# Patient Record
Sex: Male | Born: 1975 | Race: Black or African American | Hispanic: No | Marital: Single | Smoking: Current some day smoker
Health system: Southern US, Community
[De-identification: ages and names within clinical notes are randomized; demographics above are authoritative.]

## PROBLEM LIST (undated history)

## (undated) DIAGNOSIS — S39012A Strain of muscle, fascia and tendon of lower back, initial encounter: Secondary | ICD-10-CM

## (undated) DIAGNOSIS — L509 Urticaria, unspecified: Secondary | ICD-10-CM

## (undated) DIAGNOSIS — L709 Acne, unspecified: Secondary | ICD-10-CM

## (undated) DIAGNOSIS — L0291 Cutaneous abscess, unspecified: Secondary | ICD-10-CM

## (undated) HISTORY — DX: Strain of muscle, fascia and tendon of lower back, initial encounter: S39.012A

## (undated) HISTORY — DX: Urticaria, unspecified: L50.9

## (undated) HISTORY — PX: ABSCESS DRAINAGE: SHX1119

---

## 2001-08-19 ENCOUNTER — Emergency Department (HOSPITAL_COMMUNITY): Admission: EM | Admit: 2001-08-19 | Discharge: 2001-08-19 | Payer: Self-pay | Admitting: *Deleted

## 2001-08-21 ENCOUNTER — Observation Stay (HOSPITAL_COMMUNITY): Admission: EM | Admit: 2001-08-21 | Discharge: 2001-08-22 | Payer: Self-pay | Admitting: Emergency Medicine

## 2001-08-23 ENCOUNTER — Encounter (HOSPITAL_COMMUNITY): Admission: RE | Admit: 2001-08-23 | Discharge: 2001-09-22 | Payer: Self-pay | Admitting: General Surgery

## 2003-05-22 ENCOUNTER — Emergency Department (HOSPITAL_COMMUNITY): Admission: EM | Admit: 2003-05-22 | Discharge: 2003-05-23 | Payer: Self-pay | Admitting: Emergency Medicine

## 2003-09-28 ENCOUNTER — Emergency Department (HOSPITAL_COMMUNITY): Admission: EM | Admit: 2003-09-28 | Discharge: 2003-09-29 | Payer: Self-pay | Admitting: Emergency Medicine

## 2005-11-25 ENCOUNTER — Emergency Department (HOSPITAL_COMMUNITY): Admission: EM | Admit: 2005-11-25 | Discharge: 2005-11-25 | Payer: Self-pay | Admitting: Emergency Medicine

## 2007-07-30 ENCOUNTER — Emergency Department (HOSPITAL_COMMUNITY): Admission: EM | Admit: 2007-07-30 | Discharge: 2007-07-30 | Payer: Self-pay | Admitting: Emergency Medicine

## 2007-08-01 ENCOUNTER — Emergency Department (HOSPITAL_COMMUNITY): Admission: EM | Admit: 2007-08-01 | Discharge: 2007-08-01 | Payer: Self-pay | Admitting: Emergency Medicine

## 2007-08-02 ENCOUNTER — Emergency Department (HOSPITAL_COMMUNITY): Admission: EM | Admit: 2007-08-02 | Discharge: 2007-08-02 | Payer: Self-pay | Admitting: Emergency Medicine

## 2008-02-01 ENCOUNTER — Emergency Department (HOSPITAL_COMMUNITY): Admission: EM | Admit: 2008-02-01 | Discharge: 2008-02-01 | Payer: Self-pay | Admitting: Emergency Medicine

## 2008-09-26 ENCOUNTER — Emergency Department (HOSPITAL_COMMUNITY): Admission: EM | Admit: 2008-09-26 | Discharge: 2008-09-26 | Payer: Self-pay | Admitting: Emergency Medicine

## 2008-12-18 ENCOUNTER — Emergency Department (HOSPITAL_COMMUNITY): Admission: EM | Admit: 2008-12-18 | Discharge: 2008-12-18 | Payer: Self-pay | Admitting: Emergency Medicine

## 2009-04-28 ENCOUNTER — Emergency Department (HOSPITAL_COMMUNITY): Admission: EM | Admit: 2009-04-28 | Discharge: 2009-04-28 | Payer: Self-pay | Admitting: Family Medicine

## 2009-08-16 ENCOUNTER — Emergency Department (HOSPITAL_COMMUNITY): Admission: EM | Admit: 2009-08-16 | Discharge: 2009-08-16 | Payer: Self-pay | Admitting: Family Medicine

## 2010-06-01 ENCOUNTER — Emergency Department (HOSPITAL_COMMUNITY)
Admission: EM | Admit: 2010-06-01 | Discharge: 2010-06-01 | Disposition: A | Discharge status: Home / Self Care | Payer: Self-pay | Attending: Emergency Medicine | Admitting: Emergency Medicine

## 2010-06-01 DIAGNOSIS — M25539 Pain in unspecified wrist: Secondary | ICD-10-CM | POA: Insufficient documentation

## 2010-07-26 LAB — CBC
HCT: 42.4 % (ref 39.0–52.0)
MCHC: 34.1 g/dL (ref 30.0–36.0)
MCV: 98 fL (ref 78.0–100.0)
Platelets: 308 10*3/uL (ref 150–400)

## 2010-07-26 LAB — DIFFERENTIAL
Basophils Absolute: 0 10*3/uL (ref 0.0–0.1)
Eosinophils Relative: 4 % (ref 0–5)
Lymphocytes Relative: 28 % (ref 12–46)
Lymphs Abs: 1.6 10*3/uL (ref 0.7–4.0)
Neutro Abs: 3.1 10*3/uL (ref 1.7–7.7)

## 2010-07-26 LAB — COMPREHENSIVE METABOLIC PANEL
AST: 31 U/L (ref 0–37)
Albumin: 4 g/dL (ref 3.5–5.2)
BUN: 10 mg/dL (ref 6–23)
CO2: 29 mEq/L (ref 19–32)
Calcium: 9.1 mg/dL (ref 8.4–10.5)
Chloride: 102 mEq/L (ref 96–112)
Creatinine, Ser: 1 mg/dL (ref 0.4–1.5)
GFR calc Af Amer: >60 mL/min (ref >60)
GFR calc non Af Amer: >60 mL/min (ref >60)
Total Bilirubin: 0.8 mg/dL (ref 0.3–1.2)

## 2010-07-26 LAB — URINALYSIS, ROUTINE W REFLEX MICROSCOPIC
Bilirubin Urine: NEGATIVE
Glucose, UA: NEGATIVE mg/dL
Hgb urine dipstick: NEGATIVE
Ketones, ur: NEGATIVE mg/dL
Protein, ur: NEGATIVE mg/dL
Urobilinogen, UA: 0.2 mg/dL (ref 0.0–1.0)

## 2010-07-26 LAB — LIPASE, BLOOD: Lipase: 14 U/L (ref 11–59)

## 2010-09-05 NOTE — Op Note (Signed)
[facility]  Patient:    Ross Mcgee, Ross Mcgee Visit Number: [id number] MRN: [id number]          Service Type: [facility] Location: [facility] Attending Physician:  Barbaraann Barthel Dictated by:   Barbaraann Barthel, M.D. Proc. Date: 08/21/01 Admit Date:  08/23/2001   CC:         [name], M.D.   Operative Report  SURGEON:  Barbaraann Barthel, M.D.  PREOPERATIVE DIAGNOSES:  Perianal and intergluteal abscess.  POSTOPERATIVE DIAGNOSES:  Perianal and intergluteal abscess.  PROCEDURES:  1. Examination under anesthesia, rigid proctoscopy.  2. Incision and drainage of perianal and intergluteal abscess.  NOTE:  This is a 35 year old, black male, nondiabetic who had an approximately four day history of perianal and intergluteal discomfort. He was seen in the emergency room, treated with analgesics and Keflex but on Friday, returned to the emergency room on Sunday with increasing discomfort and inability to sit down. He had no other symptoms and normal bowel movements. Examination revealed that he had some fluctuant area primarily in the intergluteal area low and near the perianal area primarily on the right side.  We had planned to take him to the operating room where this could be drained further. I discussed this with his sister and his mother as well and we discussed the procedure in detail and discussed the complications not limited to but including bleeding, infection, the possibility that more surgery may be required if this developed further abscesses. Informed consent was obtained.  GROSS OPERATIVE FINDINGS:  A yellowish thick pus which was cultured for aerobes and anaerobes. The infection appeared to be primarily contained around the right perianal and right low intergluteal area.  TECHNIQUE:  The patient was placed in jackknife prone position after the adequate administration of spinal anesthesia. Digital examination and examination by proctoscope was  carried out. There seemed to be no communication with this abscess to the intrarectal space. We then prepped with Betadine solution, draped in the usual manner and a radial incision was carried out over the area of fluctuant. Obvious pus was obtained and this was drained and cultured. The wound was then irrigated, a small amount of necrotic tissue was sent as a specimen. The wound was irrigated, bleeding was controlled with a cautery device and making sure that there was no further loculations of pus and after thorough irrigation with normal saline solution, the wound was packed open with 2 inch Kerlix that had been soaked in normal saline solution. A sterile ABD pad was placed and prior to closure, all sponge, needle and instrument counts were found to be correct. Estimated blood loss was minimal. The patient received 500 cc of crystalloid intraoperatively. There were no complications. Dictated by:   Barbaraann Barthel, M.D. Attending Physician:  Barbaraann Barthel DD:  08/21/01 TD:  08/23/01 Job: [id number]

## 2010-09-05 NOTE — Consult Note (Signed)
[facility]  Patient:    Ross Mcgee, Ross Mcgee Visit Number: [id number] MRN: [id number]          Service Type: OBV Location: 3A A311 01 Attending Physician:  Barbaraann Barthel Dictated by:   Barbaraann Barthel, M.D. Proc. Date: 08/21/01 Admit Date:  08/21/2001 Discharge Date: 08/22/2001   CC:         Ross Mcgee, M.D.   Consultation Report  Surgery was asked to see this 35 year old black male who had a four day history of intragluteal perianal discomfort.  He came to the emergency room on Friday and was treated with antibiotics and analgesics and told to follow up and then returned to the emergency room on Sunday with increasing discomfort. He is a nondiabetic and has no other medical problems.  PHYSICAL EXAMINATION  GENERAL:  Pleasant 35 year old black male.  VITAL SIGNS:  Height 5 feet 9 inches, weight approximately 165 pounds, blood pressure 137/69, heart rate 98 per minute, temperature 98.8, respirations approximately 60 per minute.  HEENT:  Head is normocephalic.  Eyes:  Extraocular movements are intact. Pupils are round and react to light and accommodation.  There is no conjunctivae pallor or scleral injection.  Sclerae have normal ______.  NECK:  Supple and cylindrical without jugular venous distention, thyromegaly, or tracheal deviation.  No adenopathy is palpated.  CHEST:  Clear both anterior and posterior to auscultation.  HEART:  Regular rhythm.  ABDOMEN:  Soft.  No femoral or inguinal hernias are appreciated.  GENITOURINARY:  Genitalia is normal.  RECTAL:  The patient is tender around the intragluteal area primarily on the right side.  Rest of rectal examination was deferred due to patient discomfort.  We will do this further in the operating room.  EXTREMITIES:  Within normal limits.  REVIEW OF SYSTEMS:  Within normal limits.  The patient has had no previous surgery.  He is not a diabetic or had thyroid disease.   GENITOURINARY:  Within normal limits.  NEUROLOGIC:  Within normal limits.  GASTROINTESTINAL:  Within normal limits.  The patient takes no medications on a regular basis and has no known allergies.  He does smoke a pack of cigarettes, he says, every two to three days and he has smoked some marijuana in the past.  He has no history of alcohol or recreational drug abuse otherwise.  IMPRESSION:  Intragluteal abscess.  PLAN:  Examination under anesthesia with proctoscopy and incision and drainage of abscess in the intragluteal area.  I have discussed this in detail with the patient and his sister and will do so with his mother when she gets here.  I have discussed and will discuss complications not limited to, but including bleeding, infection, and the possible need for further surgery.  Informed consent was obtained. Dictated by:   Barbaraann Barthel, M.D. Attending Physician:  Barbaraann Barthel DD:  08/21/01 TD:  08/22/01 Job: [id number]

## 2011-02-07 ENCOUNTER — Inpatient Hospital Stay (INDEPENDENT_AMBULATORY_CARE_PROVIDER_SITE_OTHER)
Admission: RE | Admit: 2011-02-07 | Discharge: 2011-02-07 | Disposition: A | Discharge status: Home / Self Care | Payer: Self-pay | Source: Ambulatory Visit | Attending: Emergency Medicine | Admitting: Emergency Medicine

## 2011-02-07 DIAGNOSIS — S139XXA Sprain of joints and ligaments of unspecified parts of neck, initial encounter: Secondary | ICD-10-CM

## 2011-04-04 ENCOUNTER — Encounter: Payer: Self-pay | Admitting: *Deleted

## 2011-04-04 ENCOUNTER — Emergency Department (HOSPITAL_COMMUNITY)
Admission: EM | Admit: 2011-04-04 | Discharge: 2011-04-04 | Disposition: A | Discharge status: Home / Self Care | Payer: Self-pay | Source: Home / Self Care | Attending: Family Medicine | Admitting: Family Medicine

## 2011-04-04 DIAGNOSIS — M6740 Ganglion, unspecified site: Secondary | ICD-10-CM

## 2011-04-04 MED ORDER — DICLOFENAC POTASSIUM 50 MG PO TABS: 50.0000 mg | ORAL_TABLET | Freq: Three times a day (TID) | ORAL | Status: DC

## 2011-04-04 NOTE — ED Notes (Signed)
Pt with c/o right anterior wrist ? Cyst x several months pain onset x 2 days

## 2011-04-04 NOTE — ED Provider Notes (Signed)
History     CSN: [id number] Arrival date & time: 04/04/2011  9:39 AM   First MD Initiated Contact with Patient 04/04/11 [phone]      Chief Complaint  Patient presents with  . Wrist Pain    (Consider location/radiation/quality/duration/timing/severity/associated sxs/prior treatment) Patient is a 35 y.o. male presenting with wrist pain. The history is provided by the patient.  Wrist Pain This is a chronic problem. Episode onset: felt pop in wrist 8 mos ago, then sts about 2 mos ago pain worse past 2 days. The problem occurs constantly. The problem has been gradually worsening. The symptoms are aggravated by bending. He has tried nothing for the symptoms.    History reviewed. No pertinent past medical history.  Past Surgical History  Procedure Date  . Abscess drainage     History reviewed. No pertinent family history.  History  Substance Use Topics  . Smoking status: Current Everyday Smoker  . Smokeless tobacco: Not on file  . Alcohol Use: Yes      Review of Systems  Constitutional: Negative.   Musculoskeletal: Positive for joint swelling.  Neurological: Negative.     Allergies  Review of patient's allergies indicates no known allergies.  Home Medications   Current Outpatient Rx  Name Route Sig Dispense Refill  . DICLOFENAC POTASSIUM 50 MG PO TABS Oral Take 1 tablet (50 mg total) by mouth 3 (three) times daily. 30 tablet 0    BP 110/53  Pulse 67  Temp(Src) 98.5 F (36.9 C) (Oral)  Resp 16  SpO2 98%  Physical Exam  Constitutional: He appears well-developed and well-nourished.  Musculoskeletal: He exhibits tenderness.       Right wrist: He exhibits tenderness and swelling.       Arms:   ED Course  Procedures (including critical care time)  Labs Reviewed - No data to display No results found.   1. Ganglion cyst of flexor tendon sheath       MDM          Barkley Bruns, MD 04/04/11 1007

## 2011-05-19 ENCOUNTER — Emergency Department (INDEPENDENT_AMBULATORY_CARE_PROVIDER_SITE_OTHER)
Admission: EM | Admit: 2011-05-19 | Discharge: 2011-05-19 | Disposition: A | Discharge status: Home / Self Care | Payer: Self-pay | Source: Home / Self Care | Attending: Family Medicine | Admitting: Family Medicine

## 2011-05-19 ENCOUNTER — Encounter (HOSPITAL_COMMUNITY): Payer: Self-pay | Admitting: Emergency Medicine

## 2011-05-19 DIAGNOSIS — L309 Dermatitis, unspecified: Secondary | ICD-10-CM

## 2011-05-19 DIAGNOSIS — L259 Unspecified contact dermatitis, unspecified cause: Secondary | ICD-10-CM

## 2011-05-19 MED ORDER — HYDROXYZINE HCL 25 MG PO TABS: 25.0000 mg | ORAL_TABLET | Freq: Four times a day (QID) | ORAL | Status: AC

## 2011-05-19 MED ORDER — PREDNISONE 20 MG PO TABS: ORAL_TABLET | ORAL | Status: AC

## 2011-05-19 MED ORDER — CEPHALEXIN 500 MG PO CAPS: 500.0000 mg | ORAL_CAPSULE | Freq: Four times a day (QID) | ORAL | Status: AC

## 2011-05-19 NOTE — ED Notes (Signed)
Pt noticed this morning areas of red, itchy rash  on the right hand and arm

## 2011-05-22 NOTE — ED Provider Notes (Signed)
History     CSN: [id number]  Arrival date & time 05/19/11  1642   First MD Initiated Contact with Patient 05/19/11 1645      Chief Complaint  Patient presents with  . Rash    (Consider location/radiation/quality/duration/timing/severity/associated sxs/prior treatment) HPI Comments: 36 y/o male with h/o ganglion cyst in right wrist comes today c/o red patches, itchiness and swelling in right hand and arm noticed at wake up today. No known irritant or allergic contact, patient shaves his arms. No mucosal or other body areas involved.   History reviewed. No pertinent past medical history.  Past Surgical History  Procedure Date  . Abscess drainage     No family history on file.  History  Substance Use Topics  . Smoking status: Current Everyday Smoker  . Smokeless tobacco: Not on file  . Alcohol Use: Yes      Review of Systems  Constitutional: Negative for fever and chills.  HENT: Negative for facial swelling and mouth sores.   Respiratory: Negative for shortness of breath.   Skin: Positive for rash.  Neurological: Negative for dizziness and headaches.    Allergies  Review of patient's allergies indicates no known allergies.  Home Medications   Current Outpatient Rx  Name Route Sig Dispense Refill  . CEPHALEXIN 500 MG PO CAPS Oral Take 1 capsule (500 mg total) by mouth 4 (four) times daily. 20 capsule 0  . DICLOFENAC POTASSIUM 50 MG PO TABS Oral Take 1 tablet (50 mg total) by mouth 3 (three) times daily. 30 tablet 0  . HYDROXYZINE HCL 25 MG PO TABS Oral Take 1 tablet (25 mg total) by mouth every 6 (six) hours. 12 tablet 0  . PREDNISONE 20 MG PO TABS  2 tabs po daily for 5 days 10 tablet no    BP 127/71  Pulse 68  Temp(Src) 98.5 F (36.9 C) (Oral)  Resp 16  SpO2 100%  Physical Exam  Nursing note and vitals reviewed. Constitutional: He is oriented to person, place, and time. He appears well-developed and well-nourished. No distress.  HENT:  Mouth/Throat:  Oropharynx is clear and moist.  Eyes: Conjunctivae are normal.  Neck: Neck supple.  Cardiovascular: Normal rate, regular rhythm and normal heart sounds.   Pulmonary/Chest: Breath sounds normal.  Abdominal: Soft. There is no tenderness.  Lymphadenopathy:    He has no cervical adenopathy.    He has no axillary adenopathy.  Neurological: He is alert and oriented to person, place, and time.  Skin:       Macular erythema in patches with mild induration and pruritus in dorsum of right hand and arm. No vesicles or ulcers. There is associated mild swelling of the dorsum of the hand and dorsal forearm. Impress increased temperature compared with left arm. Skin is shaved, no obvious pustules. There are several tattoos not new. Radio/ulnar pulses intact. The extremity is no painful to touch.      ED Course  Procedures (including critical care time)  Labs Reviewed - No data to display No results found.   1. Dermatitis       MDM  Rash resembles urticaria but no typical in that it is comfined to the right upper extremity only. Concerned about increased temp and mild swelling associated no obvious folliculitis but possible as the extremity is shaved. swelling is mild superficial (no tense or tender) and distal, pulses are normal no typical of DVT. Possile contact dermatitis vs early signs of cellulitis. Decided to treat with prednisone, vistaril and  keflex to follow up in 48 hours or earlier if worsening symptoms.        Sharin Grave, MD 05/22/11 [phone]

## 2011-10-05 ENCOUNTER — Emergency Department (HOSPITAL_COMMUNITY)
Admission: EM | Admit: 2011-10-05 | Discharge: 2011-10-05 | Discharge status: Against Medical Advice | Payer: Self-pay | Attending: Emergency Medicine | Admitting: Emergency Medicine

## 2011-10-05 ENCOUNTER — Encounter (HOSPITAL_COMMUNITY): Payer: Self-pay | Admitting: Emergency Medicine

## 2011-10-05 DIAGNOSIS — R209 Unspecified disturbances of skin sensation: Secondary | ICD-10-CM | POA: Insufficient documentation

## 2011-10-05 DIAGNOSIS — R109 Unspecified abdominal pain: Secondary | ICD-10-CM | POA: Insufficient documentation

## 2011-10-05 DIAGNOSIS — F1210 Cannabis abuse, uncomplicated: Secondary | ICD-10-CM | POA: Insufficient documentation

## 2011-10-05 NOTE — ED Notes (Signed)
Unable to locate patient in triage, waiting room, or lobby

## 2011-10-05 NOTE — ED Notes (Signed)
PT smoked mirajuana tonight and had a few beers; presented with tingling and tremors reporting he could not catch his breathe. RN calmed pt down; respirations decreased. Lung sounds clear. Oxygenation 100%

## 2012-11-18 ENCOUNTER — Encounter (HOSPITAL_COMMUNITY): Payer: Self-pay | Admitting: Emergency Medicine

## 2012-11-18 ENCOUNTER — Emergency Department (HOSPITAL_COMMUNITY)
Admission: EM | Admit: 2012-11-18 | Discharge: 2012-11-18 | Disposition: A | Discharge status: Home / Self Care | Payer: Self-pay | Attending: Emergency Medicine | Admitting: Emergency Medicine

## 2012-11-18 DIAGNOSIS — R51 Headache: Secondary | ICD-10-CM | POA: Insufficient documentation

## 2012-11-18 DIAGNOSIS — F17200 Nicotine dependence, unspecified, uncomplicated: Secondary | ICD-10-CM | POA: Insufficient documentation

## 2012-11-18 MED ORDER — SODIUM CHLORIDE 0.9 % IV BOLUS (SEPSIS)
500.0000 mL | Freq: Once | INTRAVENOUS | Status: AC
  Administered 2012-11-18: 500 mL via INTRAVENOUS

## 2012-11-18 MED ORDER — KETOROLAC TROMETHAMINE 30 MG/ML IJ SOLN
30.0000 mg | Freq: Once | INTRAMUSCULAR | Status: AC
  Administered 2012-11-18: 30 mg via INTRAVENOUS
  Filled 2012-11-18: qty 1

## 2012-11-18 MED ORDER — PROCHLORPERAZINE EDISYLATE 5 MG/ML IJ SOLN
10.0000 mg | Freq: Four times a day (QID) | INTRAMUSCULAR | Status: DC | PRN
  Administered 2012-11-18: 10 mg via INTRAVENOUS
  Filled 2012-11-18: qty 2

## 2012-11-18 MED ORDER — DIPHENHYDRAMINE HCL 25 MG PO CAPS: 25.0000 mg | ORAL_CAPSULE | Freq: Once | ORAL | Status: DC

## 2012-11-18 MED ORDER — DIPHENHYDRAMINE HCL 50 MG/ML IJ SOLN
25.0000 mg | Freq: Once | INTRAMUSCULAR | Status: AC
  Administered 2012-11-18: 25 mg via INTRAVENOUS
  Filled 2012-11-18: qty 1

## 2012-11-18 MED ORDER — SUMATRIPTAN SUCCINATE 50 MG PO TABS: 50.0000 mg | ORAL_TABLET | ORAL | Status: DC | PRN

## 2012-11-18 MED ORDER — SODIUM CHLORIDE 0.9 % IV SOLN: Freq: Once | INTRAVENOUS | Status: DC

## 2012-11-18 NOTE — ED Notes (Addendum)
Frontal headache x1 week. Worsening today. "feels like its behind my left eye". NO Hx of migraine or head trauma. No prior Hx of headache of this type. Ambulatory from triage Had taken 2 Excedrin in past 12 hrs

## 2012-11-18 NOTE — ED Provider Notes (Signed)
I saw and evaluated the patient, reviewed the resident's note and I agree with the findings and plan.   .Face to face Exam:  General:  Awake HEENT:  Atraumatic Resp:  Normal effort Abd:  Nondistended Neuro:No focal weakness  Nelia Shi, MD 11/18/12 1339

## 2012-11-18 NOTE — ED Provider Notes (Signed)
  CSN: [id number]     Arrival date & time 11/18/12  1108 History     First MD Initiated Contact with Patient 11/18/12 1123     Chief Complaint  Patient presents with  . Headache   HPI 37 year old male with no PMH presents to the ED with complaints of headache.  He reports that his headache is located behind his left eye and began approximately 4 days ago (although it originally started on the right side).  Pain is 9/10 in severity.  No radiation.  No associated fevers, chills, abdominal pain,chest pain, weakness/numbness, or neck stiffness. He endorses some associated nausea but no vomiting. No associated photophobia or phonophobia.  He has taken Excedrin  X 2 with no relief.   History reviewed. No pertinent past medical history. Past Surgical History  Procedure Laterality Date  . Abscess drainage     History reviewed. No pertinent family history. History  Substance Use Topics  . Smoking status: Current Every Day Smoker  . Smokeless tobacco: Not on file  . Alcohol Use: Yes    Review of Systems Per HPI  Allergies  Review of patient's allergies indicates no known allergies.  Home Medications   Current Outpatient Rx  Name  Route  Sig  Dispense  Refill  . aspirin-acetaminophen-caffeine (EXCEDRIN MIGRAINE) 250-250-65 MG per tablet   Oral   Take 2 tablets by mouth every 6 (six) hours as needed for pain.          BP 121/95  Pulse 63  Temp(Src) 99.1 F (37.3 C) (Oral)  Resp 18  SpO2 98% Physical Exam  Constitutional: He is oriented to person, place, and time. He appears well-developed and well-nourished.  HENT:  Head: Normocephalic and atraumatic.  Eyes: Pupils are equal, round, and reactive to light. No scleral icterus.  Neck: Normal range of motion. Neck supple.  Cardiovascular: Normal rate and regular rhythm.  Exam reveals no gallop and no friction rub.   No murmur heard. Pulmonary/Chest: Breath sounds normal. No respiratory distress. He has no wheezes. He has no  rales.  Abdominal: Soft. He exhibits no distension. There is no tenderness.  Neurological: He is alert and oriented to person, place, and time. No cranial nerve deficit.  Sensation grossly intact.  Muscle strength 5/5 in all extremities.    ED Course   Procedures (including critical care time)  Labs Reviewed - No data to display No results found. 1. Unilateral headache     MDM  37 year old male presents with unilateral headache.  DDx Atypical migraine, Tension headache, Cluster headache.  Presentation is atypical for any of the above diagnoses.  Given normal physical exam, will treat symptomatically for atypical migraine and evaluate for improvement.  Will treat with IV Toradol, Compazine, and Benadryl and monitor for improvement.   1320 - Patient resting comfortably and reports improvement in headache.  Pain now 4/10 in severity.   Given improvement with treatment, will discharge home with Rx for Imitrex (likely atypical migraine)  Tommie Sams, DO 11/18/12 1336

## 2013-07-24 ENCOUNTER — Encounter (HOSPITAL_COMMUNITY): Payer: Self-pay | Admitting: Emergency Medicine

## 2013-07-24 ENCOUNTER — Emergency Department (INDEPENDENT_AMBULATORY_CARE_PROVIDER_SITE_OTHER)
Admission: EM | Admit: 2013-07-24 | Discharge: 2013-07-24 | Disposition: A | Discharge status: Home / Self Care | Payer: Self-pay | Source: Home / Self Care | Attending: Emergency Medicine | Admitting: Emergency Medicine

## 2013-07-24 DIAGNOSIS — L03211 Cellulitis of face: Secondary | ICD-10-CM

## 2013-07-24 DIAGNOSIS — L0201 Cutaneous abscess of face: Secondary | ICD-10-CM

## 2013-07-24 MED ORDER — DOXYCYCLINE HYCLATE 100 MG PO CAPS: 100.0000 mg | ORAL_CAPSULE | Freq: Two times a day (BID) | ORAL | Status: DC

## 2013-07-24 NOTE — Discharge Instructions (Signed)
Abscess An abscess is an infected area that contains a collection of pus and debris.It can occur in almost any part of the body. An abscess is also known as a furuncle or boil. CAUSES  An abscess occurs when tissue gets infected. This can occur from blockage of oil or sweat glands, infection of hair follicles, or a minor injury to the skin. As the body tries to fight the infection, pus collects in the area and creates pressure under the skin. This pressure causes pain. People with weakened immune systems have difficulty fighting infections and get certain abscesses more often.  SYMPTOMS Usually an abscess develops on the skin and becomes a painful mass that is red, warm, and tender. If the abscess forms under the skin, you may feel a moveable soft area under the skin. Some abscesses break open (rupture) on their own, but most will continue to get worse without care. The infection can spread deeper into the body and eventually into the bloodstream, causing you to feel ill.  DIAGNOSIS  Your caregiver will take your medical history and perform a physical exam. A sample of fluid may also be taken from the abscess to determine what is causing your infection. TREATMENT  Your caregiver may prescribe antibiotic medicines to fight the infection. However, taking antibiotics alone usually does not cure an abscess. Your caregiver may need to make a small cut (incision) in the abscess to drain the pus. In some cases, gauze is packed into the abscess to reduce pain and to continue draining the area. HOME CARE INSTRUCTIONS   Use a warm wet compresses 2-3 times daily until complete resolution of this abscess  Only take over-the-counter or prescription medicines for pain, discomfort, or fever as directed by your caregiver.  If you were prescribed antibiotics, take them as directed. Finish them even if you start to feel better.  If gauze is used, follow your caregiver's directions for changing the gauze.  To  avoid spreading the infection:  Keep your draining abscess covered with a bandage.  Wash your hands well.  Do not share personal care items, towels, or whirlpools with others.  Avoid skin contact with others.  Keep your skin and clothes clean around the abscess.  Keep all follow-up appointments as directed by your caregiver. SEEK MEDICAL CARE IF:   You have increased pain, swelling, redness, fluid drainage, or bleeding.  You have muscle aches, chills, or a general ill feeling.  You have a fever. MAKE SURE YOU:   Understand these instructions.  Will watch your condition.  Will get help right away if you are not doing well or get worse. Document Released: 01/14/2005 Document Revised: 10/06/2011 Document Reviewed: 06/19/2011 [facility] Patient Information 2014 [employer] Manor[employer]Care, [place].

## 2013-07-24 NOTE — ED Provider Notes (Signed)
CSN: [id number]     Arrival date & time 07/24/13  0903 History   First MD Initiated Contact with Patient 07/24/13 [phone]     Chief Complaint  Patient presents with  . Abscess    ?  insect bite   (Consider location/radiation/quality/duration/timing/severity/associated sxs/prior Treatment) HPI Comments: 38 year old male presents complaining of possible spider bite or abscess on his forehead. This is been there for 3 days now. He got a lot worse on Saturday and then drained pus. It has steadily gotten better since then. He has put antibiotic ointment on it for the last 2 days. No systemic symptoms.  Patient is a 38 y.o. male presenting with abscess.  Abscess Associated symptoms: no fever and no headaches     History reviewed. No pertinent past medical history. Past Surgical History  Procedure Laterality Date  . Abscess drainage     History reviewed. No pertinent family history. History  Substance Use Topics  . Smoking status: Current Every Day Smoker  . Smokeless tobacco: Not on file  . Alcohol Use: Yes    Review of Systems  Constitutional: Negative for fever and chills.  Skin: Positive for wound (see history of present illness).  Neurological: Negative for headaches.  All other systems reviewed and are negative.    Allergies  Review of patient's allergies indicates no known allergies.  Home Medications   Current Outpatient Rx  Name  Route  Sig  Dispense  Refill  . aspirin-acetaminophen-caffeine (EXCEDRIN MIGRAINE) 250-250-65 MG per tablet   Oral   Take 2 tablets by mouth every 6 (six) hours as needed for pain.         Marland Kitchen. doxycycline (VIBRAMYCIN) 100 MG capsule   Oral   Take 1 capsule (100 mg total) by mouth 2 (two) times daily.   20 capsule   0   . SUMAtriptan (IMITREX) 50 MG tablet   Oral   Take 1 tablet (50 mg total) by mouth every 2 (two) hours as needed for migraine. Do not exceed total 200 mg daily.   20 tablet   0    BP 132/72  Pulse 65  Temp(Src) 98.2  F (36.8 C) (Oral)  Resp 12  SpO2 98% Physical Exam  Nursing note and vitals reviewed. Constitutional: He is oriented to person, place, and time. He appears well-developed and well-nourished. No distress.  HENT:  Head: Normocephalic.    Pulmonary/Chest: Effort normal. No respiratory distress.  Neurological: He is alert and oriented to person, place, and time. Coordination normal.  Skin: Skin is warm and dry. No rash noted. He is not diaphoretic.  Psychiatric: He has a normal mood and affect. Judgment normal.    ED Course  Procedures (including critical care time) Labs Review Labs Reviewed - No data to display Imaging Review No results found.   MDM   1. Abscess of forehead    Treat with warm compresses 3 times daily, will provide an antibiotic prescription to take if he starts to get worse again. Otherwise, they should continue to just resolve on its on.  Meds ordered this encounter  Medications  . doxycycline (VIBRAMYCIN) 100 MG capsule    Sig: Take 1 capsule (100 mg total) by mouth 2 (two) times daily.    Dispense:  20 capsule    Refill:  0    Order Specific Question:  Supervising Provider    Answer:  Lorenz CoasterKELLER, DAVID C [[id number]]   \    Graylon GoodZachary H Cyleigh Massaro, PA-C 07/24/13 1020

## 2013-07-24 NOTE — ED Notes (Signed)
Reports possible spider bite to center of forehead.  Noticed on Friday.   Gradually gotten bigger.  Drainage on Friday.  Mild pain.  Pt has used antibiotic ointment with no relief.

## 2013-07-24 NOTE — ED Provider Notes (Signed)
Medical screening examination/treatment/procedure(s) were performed by non-physician practitioner and as supervising physician I was immediately available for consultation/collaboration.  Leslee Homeavid Noele Icenhour, M.D.  Reuben Likesavid C Tayonna Bacha, MD 07/24/13 [phone]

## 2014-02-21 ENCOUNTER — Encounter (HOSPITAL_COMMUNITY): Payer: Self-pay | Admitting: *Deleted

## 2014-02-21 ENCOUNTER — Emergency Department (INDEPENDENT_AMBULATORY_CARE_PROVIDER_SITE_OTHER)
Admission: EM | Admit: 2014-02-21 | Discharge: 2014-02-21 | Disposition: A | Discharge status: Home / Self Care | Payer: Self-pay | Source: Home / Self Care | Attending: Family Medicine | Admitting: Family Medicine

## 2014-02-21 DIAGNOSIS — IMO0001 Reserved for inherently not codable concepts without codable children: Secondary | ICD-10-CM

## 2014-02-21 DIAGNOSIS — K029 Dental caries, unspecified: Secondary | ICD-10-CM

## 2014-02-21 DIAGNOSIS — R030 Elevated blood-pressure reading, without diagnosis of hypertension: Secondary | ICD-10-CM

## 2014-02-21 MED ORDER — PENICILLIN V POTASSIUM 500 MG PO TABS
500.0000 mg | ORAL_TABLET | Freq: Three times a day (TID) | ORAL | Status: DC
Start: 2014-02-21 — End: 2015-12-15

## 2014-02-21 MED ORDER — NAPROXEN 500 MG PO TABS: 500.0000 mg | ORAL_TABLET | Freq: Two times a day (BID) | ORAL | Status: DC

## 2014-02-21 MED ORDER — LIDOCAINE VISCOUS 2 % MT SOLN: OROMUCOSAL | Status: DC

## 2014-02-21 NOTE — ED Notes (Signed)
Pt  Reports  l upper  Toothache     Which started   sev  Days  Ago intermittant  At  tne  Pt reports   Pain is  Behind  His l  Eye     He  Reports  A  History  Of  Dental problems he has  Some  Dental caries  Present  As  Well

## 2014-02-21 NOTE — ED Provider Notes (Signed)
CSN: [id number]     Arrival date & time 02/21/14  1146 History   First MD Initiated Contact with Patient 02/21/14 1247     Chief Complaint  Patient presents with  . Dental Pain   (Consider location/radiation/quality/duration/timing/severity/associated sxs/prior Treatment) Patient is a 38 y.o. male presenting with tooth pain. The history is provided by the patient.  Dental Pain Location:  Upper Upper teeth location:  14/LU 1st molar Quality:  Dull and aching Severity:  Moderate Onset quality:  Gradual Duration:  24 hours Timing:  Constant Progression:  Waxing and waning   History reviewed. No pertinent past medical history. Past Surgical History  Procedure Laterality Date  . Abscess drainage     History reviewed. No pertinent family history. History  Substance Use Topics  . Smoking status: Current Every Day Smoker  . Smokeless tobacco: Not on file  . Alcohol Use: Yes    Review of Systems  All other systems reviewed and are negative.   Allergies  Review of patient's allergies indicates no known allergies.  Home Medications   Prior to Admission medications   Medication Sig Start Date End Date Taking? Authorizing Provider  aspirin-acetaminophen-caffeine (EXCEDRIN MIGRAINE) [phone] MG per tablet Take 2 tablets by mouth every 6 (six) hours as needed for pain.    Historical Provider, MD  doxycycline (VIBRAMYCIN) 100 MG capsule Take 1 capsule (100 mg total) by mouth 2 (two) times daily. 07/24/13   Graylon GoodZachary H Baker, PA-C  lidocaine (XYLOCAINE) 2 % solution Apply to affected area using cotton swab Q3hrs for dental pain 02/21/14   Mathis FareJennifer Lee H Sireen Halk, PA  naproxen (NAPROSYN) 500 MG tablet Take 1 tablet (500 mg total) by mouth 2 (two) times daily. As needed for pain 02/21/14   Mathis FareJennifer Lee H Caymen Dubray, PA  penicillin v potassium (VEETID) 500 MG tablet Take 1 tablet (500 mg total) by mouth 3 (three) times daily. X 7 days 02/21/14   Ria ClockJennifer Lee H Shirl Ludington, PA  SUMAtriptan (IMITREX)  50 MG tablet Take 1 tablet (50 mg total) by mouth every 2 (two) hours as needed for migraine. Do not exceed total 200 mg daily. 11/18/12   Jayce G Cook, DO   BP 165/89 mmHg  Pulse 84  Temp(Src) 98.9 F (37.2 C) (Oral)  Resp 16  SpO2 98% Physical Exam  Constitutional: He is oriented to person, place, and time. He appears well-developed and well-nourished. No distress.  HENT:  Head: Normocephalic and atraumatic.  Right Ear: Hearing and external ear normal.  Left Ear: Hearing and external ear normal.  Nose: Nose normal.  Mouth/Throat:    Cardiovascular: Normal rate.   Pulmonary/Chest: Effort normal.  Neurological: He is alert and oriented to person, place, and time.  Skin: Skin is warm and dry.  Psychiatric: He has a normal mood and affect. His behavior is normal.  Nursing note and vitals reviewed.   ED Course  Procedures (including critical care time) Labs Review Labs Reviewed - No data to display  Imaging Review No results found.   MDM   1. Dental cavity   2. Elevated blood pressure   blood pressure is also elevated at today's visit. This should be re-checked by the primary care doctor of your choice in the next 2-3 weeks.   Meds as directed Advised to quit smoking  Find a dentist     Ria ClockJennifer Lee H Dena Esperanza, GeorgiaPA 02/21/14 1311

## 2014-02-21 NOTE — Discharge Instructions (Signed)
Your blood pressure is also elevated at today's visit. This should be re-checked by the primary care doctor of your choice in the next 2-3 weeks.  Dental Care and Dentist Visits Dental care supports good overall health. Regular dental visits can also help you avoid dental pain, bleeding, infection, and other more serious health problems in the future. It is important to keep the mouth healthy because diseases in the teeth, gums, and other oral tissues can spread to other areas of the body. Some problems, such as diabetes, heart disease, and pre-term labor have been associated with poor oral health.  See your dentist every 6 months. If you experience emergency problems such as a toothache or broken tooth, go to the dentist right away. If you see your dentist regularly, you may catch problems early. It is easier to be treated for problems in the early stages.  WHAT TO EXPECT AT A DENTIST VISIT  Your dentist will look for many common oral health problems and recommend proper treatment. At your regular dental visit, you can expect:  Gentle cleaning of the teeth and gums. This includes scraping and polishing. This helps to remove the sticky substance around the teeth and gums (plaque). Plaque forms in the mouth shortly after eating. Over time, plaque hardens on the teeth as tartar. If tartar is not removed regularly, it can cause problems. Cleaning also helps remove stains.  Periodic X-rays. These pictures of the teeth and supporting bone will help your dentist assess the health of your teeth.  Periodic fluoride treatments. Fluoride is a natural mineral shown to help strengthen teeth. Fluoride treatmentinvolves applying a fluoride gel or varnish to the teeth. It is most commonly done in children.  Examination of the mouth, tongue, jaws, teeth, and gums to look for any oral health problems, such as:  Cavities (dental caries). This is decay on the tooth caused by plaque, sugar, and acid in the mouth. It is  best to catch a cavity when it is small.  Inflammation of the gums caused by plaque buildup (gingivitis).  Problems with the mouth or malformed or misaligned teeth.  Oral cancer or other diseases of the soft tissues or jaws. KEEP YOUR TEETH AND GUMS HEALTHY For healthy teeth and gums, follow these general guidelines as well as your dentist's specific advice:  Have your teeth professionally cleaned at the dentist every 6 months.  Brush twice daily with a fluoride toothpaste.  Floss your teeth daily.  Ask your dentist if you need fluoride supplements, treatments, or fluoride toothpaste.  Eat a healthy diet. Reduce foods and drinks with added sugar.  Avoid smoking. TREATMENT FOR ORAL HEALTH PROBLEMS If you have oral health problems, treatment varies depending on the conditions present in your teeth and gums.  Your caregiver will most likely recommend good oral hygiene at each visit.  For cavities, gingivitis, or other oral health disease, your caregiver will perform a procedure to treat the problem. This is typically done at a separate appointment. Sometimes your caregiver will refer you to another dental specialist for specific tooth problems or for surgery. SEEK IMMEDIATE DENTAL CARE IF:  You have pain, bleeding, or soreness in the gum, tooth, jaw, or mouth area.  A permanent tooth becomes loose or separated from the gum socket.  You experience a blow or injury to the mouth or jaw area. Document Released: 12/17/2010 Document Revised: 06/29/2011 Document Reviewed: 12/17/2010 [facility] Patient Information 2015 [place]. This information is not intended to replace advice given to you  by your health care provider. Make sure you discuss any questions you have with your health care provider.  Dental Pain A tooth ache may be caused by cavities (tooth decay). Cavities expose the nerve of the tooth to air and hot or cold temperatures. It may come from an infection or abscess (also  called a boil or furuncle) around your tooth. It is also often caused by dental caries (tooth decay). This causes the pain you are having. DIAGNOSIS  Your caregiver can diagnose this problem by exam. TREATMENT   If caused by an infection, it may be treated with medications which kill germs (antibiotics) and pain medications as prescribed by your caregiver. Take medications as directed.  Only take over-the-counter or prescription medicines for pain, discomfort, or fever as directed by your caregiver.  Whether the tooth ache today is caused by infection or dental disease, you should see your dentist as soon as possible for further care. SEEK MEDICAL CARE IF: The exam and treatment you received today has been provided on an emergency basis only. This is not a substitute for complete medical or dental care. If your problem worsens or new problems (symptoms) appear, and you are unable to meet with your dentist, call or return to this location. SEEK IMMEDIATE MEDICAL CARE IF:   You have a fever.  You develop redness and swelling of your face, jaw, or neck.  You are unable to open your mouth.  You have severe pain uncontrolled by pain medicine. MAKE SURE YOU:   Understand these instructions.  Will watch your condition.  Will get help right away if you are not doing well or get worse. Document Released: 04/06/2005 Document Revised: 06/29/2011 Document Reviewed: 11/23/2007 [facility] Patient Information 2015 [place], [place]. This information is not intended to replace advice given to you by your health care provider. Make sure you discuss any questions you have with your health care provider.  Dental Caries Dental caries (also called tooth decay) is the most common oral disease. It can occur at any age but is more common in children and young adults.  HOW DENTAL CARIES DEVELOPS  The process of decay begins when bacteria and foods (particularly sugars and starches) combine in your mouth to  produce plaque. Plaque is a substance that sticks to the hard, outer surface of a tooth (enamel). The bacteria in plaque produce acids that attack enamel. These acids may also attack the root surface of a tooth (cementum) if it is exposed. Repeated attacks dissolve these surfaces and create holes in the tooth (cavities). If left untreated, the acids destroy the other layers of the tooth.  RISK FACTORS  Frequent sipping of sugary beverages.   Frequent snacking on sugary and starchy foods, especially those that easily get stuck in the teeth.   Poor oral hygiene.   Dry mouth.   Substance abuse such as methamphetamine abuse.   Broken or poor-fitting dental restorations.   Eating disorders.   Gastroesophageal reflux disease (GERD).   Certain radiation treatments to the head and neck. SYMPTOMS In the early stages of dental caries, symptoms are seldom present. Sometimes white, chalky areas may be seen on the enamel or other tooth layers. In later stages, symptoms may include:  Pits and holes on the enamel.  Toothache after sweet, hot, or cold foods or drinks are consumed.  Pain around the tooth.  Swelling around the tooth. DIAGNOSIS  Most of the time, dental caries is detected during a regular dental checkup. A diagnosis is made  after a thorough medical and dental history is taken and the surfaces of your teeth are checked for signs of dental caries. Sometimes special instruments, such as lasers, are used to check for dental caries. Dental X-ray exams may be taken so that areas not visible to the eye (such as between the contact areas of the teeth) can be checked for cavities.  TREATMENT  If dental caries is in its early stages, it may be reversed with a fluoride treatment or an application of a remineralizing agent at the dental office. Thorough brushing and flossing at home is needed to aid these treatments. If it is in its later stages, treatment depends on the location and  extent of tooth destruction:   If a small area of the tooth has been destroyed, the destroyed area will be removed and cavities will be filled with a material such as gold, silver amalgam, or composite resin.   If a large area of the tooth has been destroyed, the destroyed area will be removed and a cap (crown) will be fitted over the remaining tooth structure.   If the center part of the tooth (pulp) is affected, a procedure called a root canal will be needed before a filling or crown can be placed.   If most of the tooth has been destroyed, the tooth may need to be pulled (extracted). HOME CARE INSTRUCTIONS You can prevent, stop, or reverse dental caries at home by practicing good oral hygiene. Good oral hygiene includes:  Thoroughly cleaning your teeth at least twice a day with a toothbrush and dental floss.   Using a fluoride toothpaste. A fluoride mouth rinse may also be used if recommended by your dentist or health care provider.   Restricting the amount of sugary and starchy foods and sugary liquids you consume.   Avoiding frequent snacking on these foods and sipping of these liquids.   Keeping regular visits with a dentist for checkups and cleanings. PREVENTION   Practice good oral hygiene.  Consider a dental sealant. A dental sealant is a coating material that is applied by your dentist to the pits and grooves of teeth. The sealant prevents food from being trapped in them. It may protect the teeth for several years.  Ask about fluoride supplements if you live in a community without fluorinated water or with water that has a low fluoride content. Use fluoride supplements as directed by your dentist or health care provider.  Allow fluoride varnish applications to teeth if directed by your dentist or health care provider. Document Released: 12/27/2001 Document Revised: 08/21/2013 Document Reviewed: 04/08/2012 [facility] Patient Information 2015 [place], [place]. This  information is not intended to replace advice given to you by your health care provider. Make sure you discuss any questions you have with your health care provider.

## 2015-12-15 ENCOUNTER — Encounter (HOSPITAL_COMMUNITY): Payer: Self-pay | Admitting: Emergency Medicine

## 2015-12-15 ENCOUNTER — Emergency Department (HOSPITAL_COMMUNITY)
Admission: EM | Admit: 2015-12-15 | Discharge: 2015-12-15 | Disposition: A | Discharge status: Home / Self Care | Payer: Self-pay | Attending: Emergency Medicine | Admitting: Emergency Medicine

## 2015-12-15 DIAGNOSIS — F17210 Nicotine dependence, cigarettes, uncomplicated: Secondary | ICD-10-CM | POA: Insufficient documentation

## 2015-12-15 DIAGNOSIS — R21 Rash and other nonspecific skin eruption: Secondary | ICD-10-CM | POA: Insufficient documentation

## 2015-12-15 DIAGNOSIS — R5383 Other fatigue: Secondary | ICD-10-CM | POA: Insufficient documentation

## 2015-12-15 MED ORDER — HYDROXYZINE HCL 25 MG PO TABS: ORAL_TABLET | ORAL | 0 refills | Status: AC

## 2015-12-15 MED ORDER — DOXYCYCLINE HYCLATE 100 MG PO TABS
100.0000 mg | ORAL_TABLET | Freq: Once | ORAL | Status: AC
  Administered 2015-12-15: 100 mg via ORAL
  Filled 2015-12-15: qty 1

## 2015-12-15 MED ORDER — TRIAMCINOLONE ACETONIDE 0.1 % EX CREA: 1.0000 "application " | TOPICAL_CREAM | Freq: Two times a day (BID) | CUTANEOUS | 1 refills | Status: AC

## 2015-12-15 MED ORDER — PREDNISONE 20 MG PO TABS
40.0000 mg | ORAL_TABLET | Freq: Once | ORAL | Status: AC
  Administered 2015-12-15: 40 mg via ORAL
  Filled 2015-12-15: qty 2

## 2015-12-15 MED ORDER — DOXYCYCLINE HYCLATE 100 MG PO CAPS: 100.0000 mg | ORAL_CAPSULE | Freq: Two times a day (BID) | ORAL | 0 refills | Status: DC

## 2015-12-15 NOTE — ED Triage Notes (Signed)
States had a tick bite to groin a few weeks ago and then a week ago noted a rash on right arm and left leg.  Denies other symptoms.

## 2015-12-15 NOTE — ED Provider Notes (Signed)
[facility]-EMERGENCY DEPT Provider Note   CSN: [id number] Arrival date & time: 12/15/15  1707  By signing my name below, I, Linna DarnerRussell Turner, attest that this documentation has been prepared under the direction and in the presence of Ivery QualeHobson Lawsyn Heiler, PA-C. Electronically Signed: Linna Darnerussell Turner, Scribe. 12/15/2015. 5:52 PM.  History   Chief Complaint Chief Complaint  Patient presents with  . Rash    The history is provided by the patient. No language interpreter was used.  Rash   This is a new problem. The current episode started more than 1 week ago. The problem has not changed since onset.The problem is associated with an insect bite/sting (tick bite). There has been no fever. The rash is present on the right arm, right upper leg and left upper leg. The pain is at a severity of 0/10. The patient is experiencing no pain. Associated symptoms include itching. He has tried nothing for the symptoms.    HPI Comments: Ross Mcgee is a 40 y.o. male who presents to the Emergency Department complaining of pruritic rash to various places on his body beginning about a week ago. Pt reports he was bitten by a tick a week and a half ago and noticed the rash shortly thereafter. He notes a rash to his left thigh/left groin area, right upper arm, and right lower extremity. He states the areas are not painful, just pruritic. Pt notes some associated sluggishness and fatigue since onset. He denies h/o tick-born illnesses or immunocompromised conditions. Pt has no known allergies to medication. He denies fever, vomiting, swelling or any other associated symptoms.  History reviewed. No pertinent past medical history.  There are no active problems to display for this patient.   Past Surgical History:  Procedure Laterality Date  . ABSCESS DRAINAGE         Home Medications    Prior to Admission medications   Not on File    Family History History reviewed. No pertinent family history.  Social  History Social History  Substance Use Topics  . Smoking status: Current Every Day Smoker    Packs/day: 0.50    Types: Cigarettes  . Smokeless tobacco: Not on file  . Alcohol use Yes     Comment: weekly     Allergies   Review of patient's allergies indicates no known allergies.   Review of Systems Review of Systems  Constitutional: Positive for fatigue. Negative for fever.  Gastrointestinal: Negative for vomiting.  Musculoskeletal: Negative for arthralgias, joint swelling and myalgias.  Skin: Positive for itching and rash.  All other systems reviewed and are negative.   Physical Exam Updated Vital Signs BP 128/73 (BP Location: Left Arm)   Pulse 77   Temp 98.4 F (36.9 C) (Oral)   Resp 16   Ht 5\' 10"  (1.778 m)   Wt 200 lb (90.7 kg)   SpO2 96%   BMI 28.70 kg/m   Physical Exam  Constitutional: He is oriented to person, place, and time. He appears well-developed and well-nourished. No distress.  HENT:  Head: Normocephalic and atraumatic.  Face is symmetrical. No swelling of the tongue. Airway is patent.  Eyes: Conjunctivae and EOM are normal.  Neck: Neck supple. No tracheal deviation present.  Cardiovascular: Normal rate.   Pulmonary/Chest: Effort normal. No respiratory distress.  Lungs are clear. Symmetrical rise and fall of the chest. Patient speaks in complete sentences.  Musculoskeletal: Normal range of motion.  Neurological: He is alert and oriented to person, place, and time.  Skin: Skin  is warm and dry.  Red, macular, round rash with a clear center with exception of one raised blister area in the center located at the right shoulder, left hip area, and right leg. No red streaks appreciated. No drainage. The areas are not hot.  Psychiatric: He has a normal mood and affect. His behavior is normal.  Nursing note and vitals reviewed.   ED Treatments / Results  Labs (all labs ordered are listed, but only abnormal results are displayed) Labs Reviewed - No data  to display  EKG  EKG Interpretation None       Radiology No results found.  Procedures Procedures (including critical care time)  DIAGNOSTIC STUDIES: Oxygen Saturation is 96% on RA, adequate by my interpretation.    COORDINATION OF CARE: 5:52 PM Discussed treatment plan with pt at bedside and pt agreed to plan.  Medications Ordered in ED Medications - No data to display   Initial Impression / Assessment and Plan / ED Course  I have reviewed the triage vital signs and the nursing notes.  Pertinent labs & imaging results that were available during my care of the patient were reviewed by me and considered in my medical decision making (see chart for details).  Clinical Course    **I have reviewed nursing notes, vital signs, and all appropriate lab and imaging results for this patient.* **I personally performed the services described in this documentation, which was scribed in my presence. The recorded information has been reviewed and is accurate.*  Final Clinical Impressions(s) / ED Diagnoses  Patient has a red macular rash in the semicircular distribution with a clear center with exception of a blister in the center on. Is no red streaks or signs of cellulitis. The vital signs within normal limits. The pulse oximetry is 98%. There is no signs of any anaphylactic type changes. The patient will be treated with doxycycline, as he states he was bitten a tick recently. He will also be treated with triamcinolone cream and Decadron. Patient is to follow-up with Dr. Hall-dermatology if not improving.    Final diagnoses:  None    New Prescriptions New Prescriptions   No medications on file     Ivery Quale, PA-C 12/15/15 1829    Samuel Jester, DO 12/19/15 2222

## 2015-12-15 NOTE — Discharge Instructions (Signed)
Please apply triamcinolone to the rash areas morning and bedtime. Please use Decadron 2 times daily with food. Use Vistaril every 6 hours as needed for itching. Vistaril may cause drowsiness, please do not drive, drink alcohol, operate machinery, or participate in activities requiring concentration when taking this medication. Please see Dr. Margo AyeHall, or member of his team for dermatology assessment of this rash if not improving with the medications above.

## 2016-11-29 ENCOUNTER — Encounter (HOSPITAL_COMMUNITY): Payer: Self-pay | Admitting: Nurse Practitioner

## 2016-11-29 ENCOUNTER — Emergency Department (HOSPITAL_COMMUNITY)
Admission: EM | Admit: 2016-11-29 | Discharge: 2016-11-29 | Disposition: A | Discharge status: Home / Self Care | Payer: Self-pay | Attending: Emergency Medicine | Admitting: Emergency Medicine

## 2016-11-29 DIAGNOSIS — F17210 Nicotine dependence, cigarettes, uncomplicated: Secondary | ICD-10-CM | POA: Insufficient documentation

## 2016-11-29 DIAGNOSIS — M436 Torticollis: Secondary | ICD-10-CM | POA: Insufficient documentation

## 2016-11-29 DIAGNOSIS — Z79899 Other long term (current) drug therapy: Secondary | ICD-10-CM | POA: Insufficient documentation

## 2016-11-29 MED ORDER — CYCLOBENZAPRINE HCL 10 MG PO TABS
10.0000 mg | ORAL_TABLET | Freq: Once | ORAL | Status: AC
  Administered 2016-11-29: 10 mg via ORAL
  Filled 2016-11-29: qty 1

## 2016-11-29 MED ORDER — KETOROLAC TROMETHAMINE 60 MG/2ML IM SOLN
30.0000 mg | Freq: Once | INTRAMUSCULAR | Status: AC
  Administered 2016-11-29: 30 mg via INTRAMUSCULAR
  Filled 2016-11-29: qty 2

## 2016-11-29 MED ORDER — DICLOFENAC SODIUM 50 MG PO TBEC: 50.0000 mg | DELAYED_RELEASE_TABLET | Freq: Two times a day (BID) | ORAL | 0 refills | Status: DC

## 2016-11-29 MED ORDER — CYCLOBENZAPRINE HCL 10 MG PO TABS: 10.0000 mg | ORAL_TABLET | Freq: Two times a day (BID) | ORAL | 0 refills | Status: DC | PRN

## 2016-11-29 NOTE — ED Triage Notes (Signed)
Pt is c/o left neck pain that has been ongoing for a week and half that radiates to the shoulder.

## 2016-11-29 NOTE — ED Provider Notes (Signed)
[facility]-EMERGENCY DEPT Provider Note   CSN: [id number] Arrival date & time: 11/29/16  1953     History   Chief Complaint Chief Complaint  Patient presents with  . Neck Pain    HPI Ross Mcgee is a 41 y.o. male who presents to the ED with neck pain. Patient reports that he just woke up one morning and the pain was there and has continued. The pain is worse with movement of the neck. The pain is located on the left side of the neck and today is radiating to the shoulder. 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   The history is provided by the patient. No language interpreter was used.  Neck Pain   This is a new problem. The current episode started more than 1 week ago. The problem occurs constantly. Associated with: sleeping wrong. There has been no fever. The pain is moderate. The symptoms are aggravated by position. Pertinent negatives include no headaches. He has tried nothing for the symptoms.    History reviewed. No pertinent past medical history.  There are no active problems to display for this patient.   Past Surgical History:  Procedure Laterality Date  . ABSCESS DRAINAGE         Home Medications    Prior to Admission medications   Medication Sig Start Date End Date Taking? Authorizing Provider  cyclobenzaprine (FLEXERIL) 10 MG tablet Take 1 tablet (10 mg total) by mouth 2 (two) times daily as needed for muscle spasms. 11/29/16   Janne Napoleon, NP  diclofenac (VOLTAREN) 50 MG EC tablet Take 1 tablet (50 mg total) by mouth 2 (two) times daily. 11/29/16   Janne Napoleon, NP  doxycycline (VIBRAMYCIN) 100 MG capsule Take 1 capsule (100 mg total) by mouth 2 (two) times daily. 12/15/15   Ivery Quale, PA-C  hydrOXYzine (ATARAX/VISTARIL) 25 MG tablet 1 po q6h prn itching 12/15/15   Ivery Quale, PA-C  triamcinolone cream (KENALOG) 0.1 % Apply 1 application topically 2 (two) times daily. 12/15/15   Ivery Quale, PA-C    Family  History History reviewed. No pertinent family history.  Social History Social History  Substance Use Topics  . Smoking status: Current Every Day Smoker    Packs/day: 0.50    Types: Cigarettes  . Smokeless tobacco: Not on file  . Alcohol use Yes     Comment: weekly     Allergies   Patient has no known allergies.   Review of Systems Review of Systems  Constitutional: Negative for chills and fever.  HENT: Negative.   Gastrointestinal: Negative for nausea and vomiting.  Musculoskeletal: Positive for neck pain.  Skin: Negative for wound.  Neurological: Negative for syncope and headaches.  Psychiatric/Behavioral: The patient is not nervous/anxious.      Physical Exam Updated Vital Signs BP (!) 128/92 (BP Location: Right Arm)   Pulse 64   Temp 98.1 F (36.7 C) (Oral)   Resp 18   SpO2 99%   Physical Exam  Constitutional: He appears well-developed and well-nourished. No distress.  HENT:  Head: Normocephalic and atraumatic.  Eyes: EOM are normal.  Neck: Trachea normal. Neck supple. No JVD present. Muscular tenderness (left side of neck) present. No spinous process tenderness present. Decreased range of motion: due to pain.  Cardiovascular: Normal rate and regular rhythm.   Pulmonary/Chest: Effort normal and breath sounds normal.  Musculoskeletal:  Radial pulses 2+ adequate circulation.  Neurological: He is alert. No  sensory deficit. Gait normal.  Skin: Skin is warm and dry.  Psychiatric: He has a normal mood and affect. His behavior is normal.  Nursing note and vitals reviewed.    ED Treatments / Results  Labs (all labs ordered are listed, but only abnormal results are displayed) Labs Reviewed - No data to display   Radiology No results found.  Procedures Procedures (including critical care time)  Medications Ordered in ED Medications  cyclobenzaprine (FLEXERIL) tablet 10 mg (10 mg Oral Given 11/29/16 2304)  ketorolac (TORADOL) injection 30 mg (30 mg  Intramuscular Given 11/29/16 2305)   Pain improved with Flexeril while in the ED.   Initial Impression / Assessment and Plan / ED Course  I have reviewed the triage vital signs and the nursing notes.   Final Clinical Impressions(s) / ED Diagnoses  41 y.o. male with left side neck pain that he woke up with 2 weeks ago stable for d/c without tenderness over the cervical spine, no fever, no meningeal signs. Will treat with muscle relaxants and NSAIDS and patient instructed to return if symptoms are not improving with treatment.  Final diagnoses:  Torticollis, acute    New Prescriptions New Prescriptions   CYCLOBENZAPRINE (FLEXERIL) 10 MG TABLET    Take 1 tablet (10 mg total) by mouth 2 (two) times daily as needed for muscle spasms.   DICLOFENAC (VOLTAREN) 50 MG EC TABLET    Take 1 tablet (50 mg total) by mouth 2 (two) times daily.     Kerrie Buffaloeese, Hope EskdaleM, [place]NP 11/29/16 2350    Lorre NickAllen, Anthony, MD 11/30/16 [phone]

## 2017-02-16 ENCOUNTER — Emergency Department: Payer: Self-pay

## 2017-02-16 ENCOUNTER — Encounter (HOSPITAL_COMMUNITY): Payer: Self-pay | Admitting: *Deleted

## 2017-02-16 ENCOUNTER — Emergency Department (HOSPITAL_COMMUNITY): Payer: Self-pay

## 2017-02-16 ENCOUNTER — Emergency Department (HOSPITAL_COMMUNITY)
Admission: EM | Admit: 2017-02-16 | Discharge: 2017-02-16 | Disposition: A | Discharge status: Home / Self Care | Payer: Self-pay | Attending: Emergency Medicine | Admitting: Emergency Medicine

## 2017-02-16 DIAGNOSIS — S76301A Unspecified injury of muscle, fascia and tendon of the posterior muscle group at thigh level, right thigh, initial encounter: Secondary | ICD-10-CM | POA: Insufficient documentation

## 2017-02-16 DIAGNOSIS — M25551 Pain in right hip: Secondary | ICD-10-CM | POA: Insufficient documentation

## 2017-02-16 DIAGNOSIS — T1490XA Injury, unspecified, initial encounter: Secondary | ICD-10-CM | POA: Insufficient documentation

## 2017-02-16 DIAGNOSIS — F17210 Nicotine dependence, cigarettes, uncomplicated: Secondary | ICD-10-CM | POA: Insufficient documentation

## 2017-02-16 DIAGNOSIS — Y929 Unspecified place or not applicable: Secondary | ICD-10-CM | POA: Insufficient documentation

## 2017-02-16 DIAGNOSIS — X500XXA Overexertion from strenuous movement or load, initial encounter: Secondary | ICD-10-CM | POA: Insufficient documentation

## 2017-02-16 DIAGNOSIS — Y9389 Activity, other specified: Secondary | ICD-10-CM | POA: Insufficient documentation

## 2017-02-16 DIAGNOSIS — Y999 Unspecified external cause status: Secondary | ICD-10-CM | POA: Insufficient documentation

## 2017-02-16 DIAGNOSIS — M79671 Pain in right foot: Secondary | ICD-10-CM

## 2017-02-16 DIAGNOSIS — Z79899 Other long term (current) drug therapy: Secondary | ICD-10-CM | POA: Insufficient documentation

## 2017-02-16 LAB — COMPREHENSIVE METABOLIC PANEL
ALT: 45 U/L (ref 17–63)
ANION GAP: 8 (ref 5–15)
AST: 36 U/L (ref 15–41)
Albumin: 4.1 g/dL (ref 3.5–5.0)
Alkaline Phosphatase: 112 U/L (ref 38–126)
BUN: 13 mg/dL (ref 6–20)
CHLORIDE: 102 mmol/L (ref 101–111)
CO2: 28 mmol/L (ref 22–32)
CREATININE: 1.16 mg/dL (ref 0.61–1.24)
Calcium: 8.9 mg/dL (ref 8.9–10.3)
GFR calc non Af Amer: >60 mL/min (ref >60)
Glucose, Bld: 103 mg/dL — ABNORMAL HIGH (ref 65–99)
POTASSIUM: 4 mmol/L (ref 3.5–5.1)
SODIUM: 138 mmol/L (ref 135–145)
Total Bilirubin: 0.7 mg/dL (ref 0.3–1.2)
Total Protein: 7.8 g/dL (ref 6.5–8.1)

## 2017-02-16 LAB — CBC WITH DIFFERENTIAL/PLATELET
Basophils Absolute: 0 10*3/uL (ref 0.0–0.1)
Basophils Relative: 1 %
Eosinophils Absolute: 0.2 10*3/uL (ref 0.0–0.7)
Eosinophils Relative: 3 %
HEMATOCRIT: 42.8 % (ref 39.0–52.0)
HEMOGLOBIN: 14.5 g/dL (ref 13.0–17.0)
LYMPHS ABS: 2.5 10*3/uL (ref 0.7–4.0)
LYMPHS PCT: 34 %
MCH: 33.5 pg (ref 26.0–34.0)
MCHC: 33.9 g/dL (ref 30.0–36.0)
MCV: 98.8 fL (ref 78.0–100.0)
MONOS PCT: 11 %
Monocytes Absolute: 0.8 10*3/uL (ref 0.1–1.0)
NEUTROS ABS: 3.8 10*3/uL (ref 1.7–7.7)
NEUTROS PCT: 51 %
Platelets: 329 10*3/uL (ref 150–400)
RBC: 4.33 MIL/uL (ref 4.22–5.81)
RDW: 12.5 % (ref 11.5–15.5)
WBC: 7.4 10*3/uL (ref 4.0–10.5)

## 2017-02-16 LAB — LACTIC ACID, PLASMA
Lactic Acid, Venous: 0.8 mmol/L (ref 0.5–1.9)
Lactic Acid, Venous: 0.8 mmol/L (ref 0.5–1.9)

## 2017-02-16 LAB — CK: Total CK: 266 U/L (ref 49–397)

## 2017-02-16 MED ORDER — HYDROCODONE-ACETAMINOPHEN 5-325 MG PO TABS: 1.0000 | ORAL_TABLET | Freq: Four times a day (QID) | ORAL | 0 refills | Status: DC | PRN

## 2017-02-16 MED ORDER — NAPROXEN 500 MG PO TABS: 500.0000 mg | ORAL_TABLET | Freq: Two times a day (BID) | ORAL | 0 refills | Status: DC

## 2017-02-16 MED ORDER — CYCLOBENZAPRINE HCL 5 MG PO TABS: 5.0000 mg | ORAL_TABLET | Freq: Three times a day (TID) | ORAL | 0 refills | Status: DC | PRN

## 2017-02-16 MED ORDER — SODIUM CHLORIDE 0.9 % IV BOLUS (SEPSIS)
500.0000 mL | Freq: Once | INTRAVENOUS | Status: AC
  Administered 2017-02-16: 500 mL via INTRAVENOUS

## 2017-02-16 MED ORDER — ONDANSETRON HCL 4 MG/2ML IJ SOLN
4.0000 mg | Freq: Once | INTRAMUSCULAR | Status: AC
  Administered 2017-02-16: 4 mg via INTRAVENOUS
  Filled 2017-02-16: qty 2

## 2017-02-16 MED ORDER — KETOROLAC TROMETHAMINE 30 MG/ML IJ SOLN
30.0000 mg | Freq: Once | INTRAMUSCULAR | Status: AC
  Administered 2017-02-16: 30 mg via INTRAVENOUS
  Filled 2017-02-16: qty 1

## 2017-02-16 MED ORDER — IOPAMIDOL (ISOVUE-300) INJECTION 61%
100.0000 mL | Freq: Once | INTRAVENOUS | Status: AC | PRN
  Administered 2017-02-16: 100 mL via INTRAVENOUS

## 2017-02-16 MED ORDER — FENTANYL CITRATE (PF) 100 MCG/2ML IJ SOLN
50.0000 ug | Freq: Once | INTRAMUSCULAR | Status: AC
  Administered 2017-02-16: 50 ug via INTRAVENOUS
  Filled 2017-02-16: qty 2
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
## 2017-02-16 NOTE — ED Triage Notes (Signed)
Pt states right leg pain that started Sunday before going to work. Pain has become worse.

## 2017-02-16 NOTE — Discharge Instructions (Signed)
Wear the knee immobilizer until rechecked by Dr Romeo AppleHarrison. Call his office for an appointment. Use the crutches until you are able to walk without them. Take the medications as prescribed. Return to the ED if you get constant pain in your toes and foot, they change colors or get cold.

## 2017-03-30 ENCOUNTER — Other Ambulatory Visit: Payer: Self-pay

## 2017-03-30 ENCOUNTER — Encounter (HOSPITAL_COMMUNITY): Payer: Self-pay | Admitting: Emergency Medicine

## 2017-03-30 ENCOUNTER — Emergency Department (HOSPITAL_COMMUNITY)
Admission: EM | Admit: 2017-03-30 | Discharge: 2017-03-30 | Disposition: A | Discharge status: Home / Self Care | Payer: Self-pay | Attending: Emergency Medicine | Admitting: Emergency Medicine

## 2017-03-30 DIAGNOSIS — F17210 Nicotine dependence, cigarettes, uncomplicated: Secondary | ICD-10-CM | POA: Insufficient documentation

## 2017-03-30 DIAGNOSIS — H6691 Otitis media, unspecified, right ear: Secondary | ICD-10-CM | POA: Insufficient documentation

## 2017-03-30 DIAGNOSIS — Z79899 Other long term (current) drug therapy: Secondary | ICD-10-CM | POA: Insufficient documentation

## 2017-03-30 DIAGNOSIS — H6690 Otitis media, unspecified, unspecified ear: Secondary | ICD-10-CM

## 2017-03-30 MED ORDER — AMOXICILLIN 500 MG PO CAPS: 500.0000 mg | ORAL_CAPSULE | Freq: Three times a day (TID) | ORAL | 0 refills | Status: DC

## 2017-03-30 NOTE — Discharge Instructions (Signed)
Return if any problems.

## 2017-03-30 NOTE — ED Provider Notes (Signed)
[facility] Provider [facility]   CSN: [id number] Arrival date & time: 03/30/17  1256     History   Chief Complaint Chief Complaint  Patient presents with  . Otalgia    HPI Ross Mcgee is a 41 y.o. male.  The history is provided by the patient. No language interpreter was used.  Otalgia  This is a new problem. The current episode started more than 1 week ago. There is pain in the right ear. The problem occurs constantly. The problem has been gradually worsening. There has been no fever. The fever has been present for less than 1 day. The pain is moderate. Associated symptoms include cough. Pertinent negatives include no ear discharge and no rhinorrhea. His past medical history does not include chronic ear infection.    History reviewed. No pertinent past medical history.  There are no active problems to display for this patient.   Past Surgical History:  Procedure Laterality Date  . ABSCESS DRAINAGE         Home Medications    Prior to Admission medications   Medication Sig Start Date End Date Taking? Authorizing Provider  amoxicillin (AMOXIL) 500 MG capsule Take 1 capsule (500 mg total) by mouth 3 (three) times daily. 03/30/17   Elson AreasSofia, Leslie K, PA-C  cyclobenzaprine (FLEXERIL) 5 MG tablet Take 1 tablet (5 mg total) by mouth 3 (three) times daily as needed. 02/16/17   Devoria AlbeKnapp, Iva, MD  diclofenac (VOLTAREN) 50 MG EC tablet Take 1 tablet (50 mg total) by mouth 2 (two) times daily. 11/29/16   Janne NapoleonNeese, Hope M, NP  doxycycline (VIBRAMYCIN) 100 MG capsule Take 1 capsule (100 mg total) by mouth 2 (two) times daily. 12/15/15   Ivery QualeBryant, Hobson, PA-C  HYDROcodone-acetaminophen (NORCO/VICODIN) 5-325 MG tablet Take 1 tablet by mouth every 6 (six) hours as needed. 02/16/17   Devoria AlbeKnapp, Iva, MD  hydrOXYzine (ATARAX/VISTARIL) 25 MG tablet 1 po q6h prn itching 12/15/15   Ivery QualeBryant, Hobson, PA-C  naproxen (NAPROSYN) 500 MG tablet Take 1 tablet (500 mg total) by mouth 2 (two)  times daily. 02/16/17   Devoria AlbeKnapp, Iva, MD  triamcinolone cream (KENALOG) 0.1 % Apply 1 application topically 2 (two) times daily. 12/15/15   Ivery QualeBryant, Hobson, PA-C    Family History History reviewed. No pertinent family history.  Social History Social History   Tobacco Use  . Smoking status: Current Every Day Smoker    Packs/day: 0.50    Types: Cigarettes  . Smokeless tobacco: Never Used  Substance Use Topics  . Alcohol use: Yes    Comment: weekly  . Drug use: Yes    Types: Marijuana     Allergies   Patient has no known allergies.   Review of Systems Review of Systems  HENT: Positive for ear pain. Negative for ear discharge and rhinorrhea.   Respiratory: Positive for cough.   All other systems reviewed and are negative.    Physical Exam Updated Vital Signs BP 130/78 (BP Location: Right Arm)   Pulse 83   Temp (!) 100.4 F (38 C) (Oral)   Resp 17   SpO2 95%   Physical Exam  Constitutional: He appears well-developed and well-nourished.  HENT:  Head: Normocephalic and atraumatic.  Nose: Nose normal.  Mouth/Throat: Oropharynx is clear and moist.  Right tm erythematous, landmarks obscured.    Eyes: Conjunctivae are normal.  Neck: Neck supple.  Cardiovascular: Normal rate and regular rhythm.  No murmur heard. Pulmonary/Chest: Effort normal and breath sounds normal. No respiratory  distress.  Abdominal: Soft. There is no tenderness.  Musculoskeletal: He exhibits no edema.  Neurological: He is alert.  Skin: Skin is warm and dry.  Psychiatric: He has a normal mood and affect.  Nursing note and vitals reviewed.    ED Treatments / Results  Labs (all labs ordered are listed, but only abnormal results are displayed) Labs Reviewed - No data to display  EKG  EKG Interpretation None       Radiology No results found.  Procedures Procedures (including critical care time)  Medications Ordered in ED Medications - No data to display   Initial Impression /  Assessment and Plan / ED Course  I have reviewed the triage vital signs and the nursing notes.  Pertinent labs & imaging results that were available during my care of the patient were reviewed by me and considered in my medical decision making (see chart for details).     Pt counseled on ear infections.  Pt is trying to quit smoking,   Final Clinical Impressions(s) / ED Diagnoses   Final diagnoses:  Acute otitis media, unspecified otitis media type    ED Discharge Orders        Ordered    amoxicillin (AMOXIL) 500 MG capsule  3 times daily     03/30/17 1644    An After Visit Summary was printed and given to the patient.    Elson AreasSofia, Leslie K, [place]-C 03/30/17 1933    Maia PlanLong, Joshua G, MD 03/31/17 [phone]

## 2017-03-30 NOTE — ED Triage Notes (Signed)
Pt c/o right ear pain x one week. Denies drainage

## 2017-04-22 ENCOUNTER — Emergency Department (HOSPITAL_COMMUNITY)
Admission: EM | Admit: 2017-04-22 | Discharge: 2017-04-22 | Discharge status: Against Medical Advice | Payer: BLUE CROSS/BLUE SHIELD | Attending: Emergency Medicine | Admitting: Emergency Medicine

## 2017-04-22 ENCOUNTER — Encounter (HOSPITAL_COMMUNITY): Payer: Self-pay | Admitting: Emergency Medicine

## 2017-04-22 DIAGNOSIS — Z5321 Procedure and treatment not carried out due to patient leaving prior to being seen by health care provider: Secondary | ICD-10-CM | POA: Diagnosis not present

## 2017-04-22 DIAGNOSIS — R51 Headache: Secondary | ICD-10-CM | POA: Diagnosis present

## 2017-04-22 NOTE — ED Triage Notes (Signed)
Patient complaining of intermittent headache x 2 days.

## 2017-04-22 NOTE — ED Notes (Signed)
Pt did not respond to call from waiting room

## 2017-04-22 NOTE — ED Notes (Signed)
No response in waiting area for treatment room

## 2019-01-17 ENCOUNTER — Other Ambulatory Visit: Payer: Self-pay | Admitting: *Deleted

## 2019-01-17 DIAGNOSIS — Z20822 Contact with and (suspected) exposure to covid-19: Secondary | ICD-10-CM

## 2019-01-18 LAB — NOVEL CORONAVIRUS, NAA: SARS-CoV-2, NAA: NOT DETECTED

## 2019-05-22 IMAGING — CT CT FEMUR *R* W/ CM
3 series · 10 of 33 positions shown, 11 images · IV contrast (Isovue)
Comparison: None.

CONTRAST:  100mL [id number] IOPAMIDOL ([id number]) INJECTION 61%

CLINICAL DATA: Acute onset of right inguinal pain. Pain from hip to
knee. Injury.

EXAM:
CT OF THE LOWER RIGHT EXTREMITY WITH CONTRAST
TECHNIQUE: Multidetector CT imaging of the lower right extremity was performed
according to the standard protocol following intravenous contrast
administration.

[Series 3: axial st · axial · 0.57mm/px · z∈[-541,-296]mm · 2 of 107 slices shown, 3 images]
[im 33/107  soft-tissue]
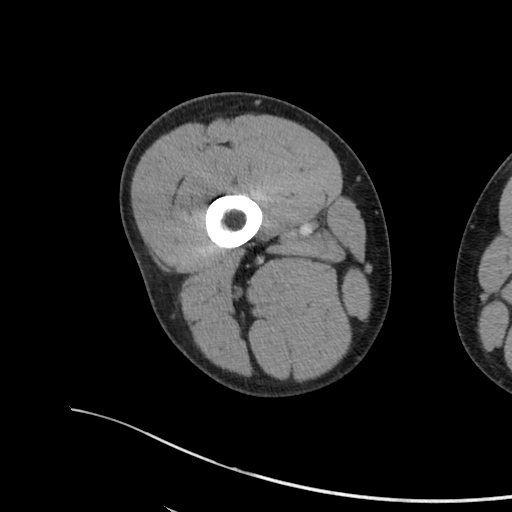
[im 33/107  bone]
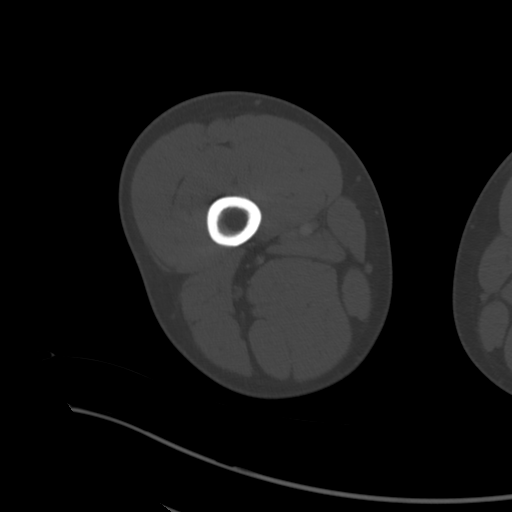
[im 82/107  bone]
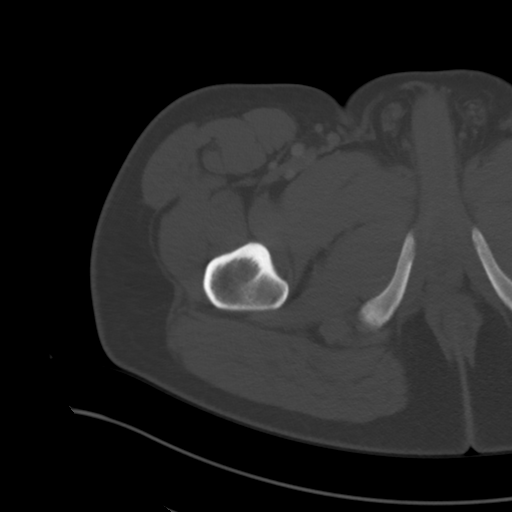

[Series 5: coronal st · coronal · 0.52mm/px · 3 of 76 slices shown]
[im 16/76  bone]
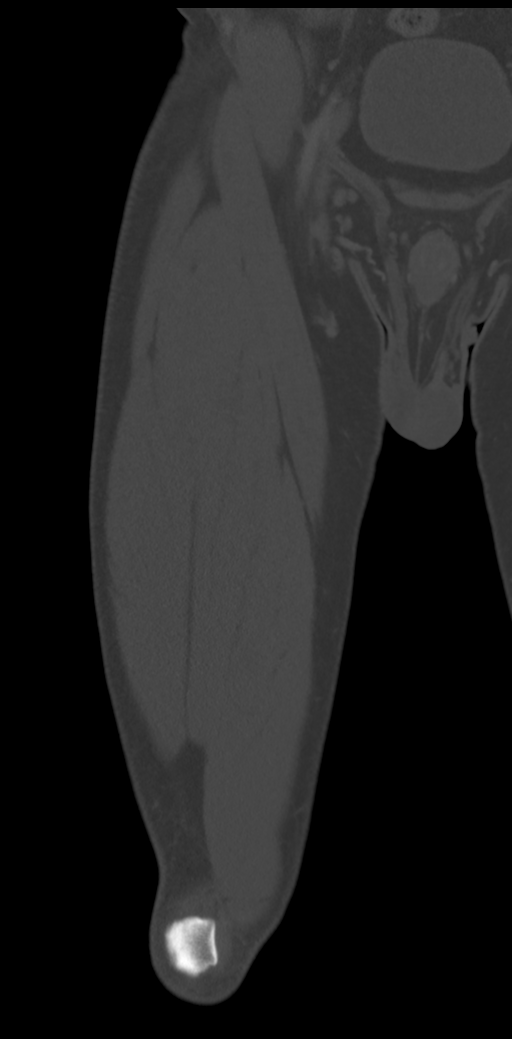
[im 31/76  bone]
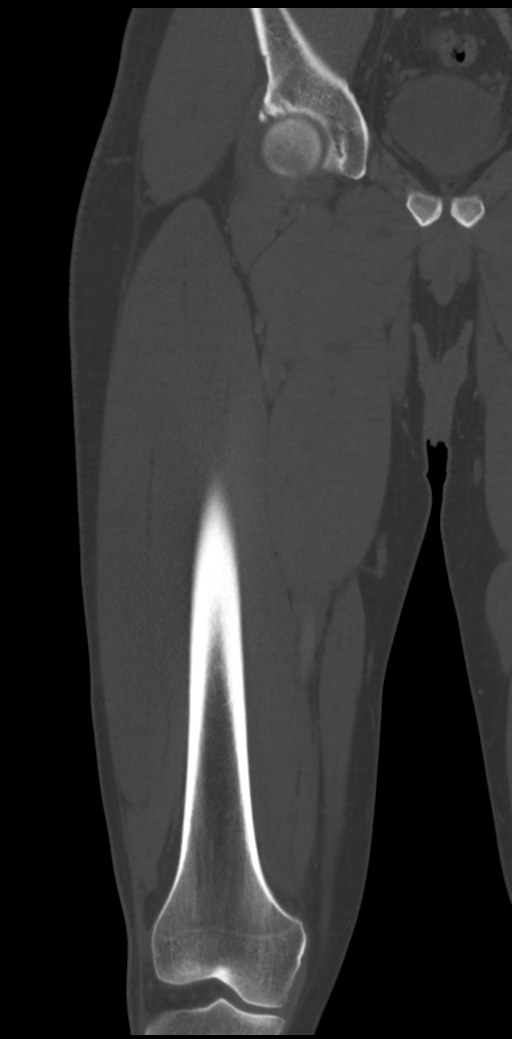
[im 46/76  bone]
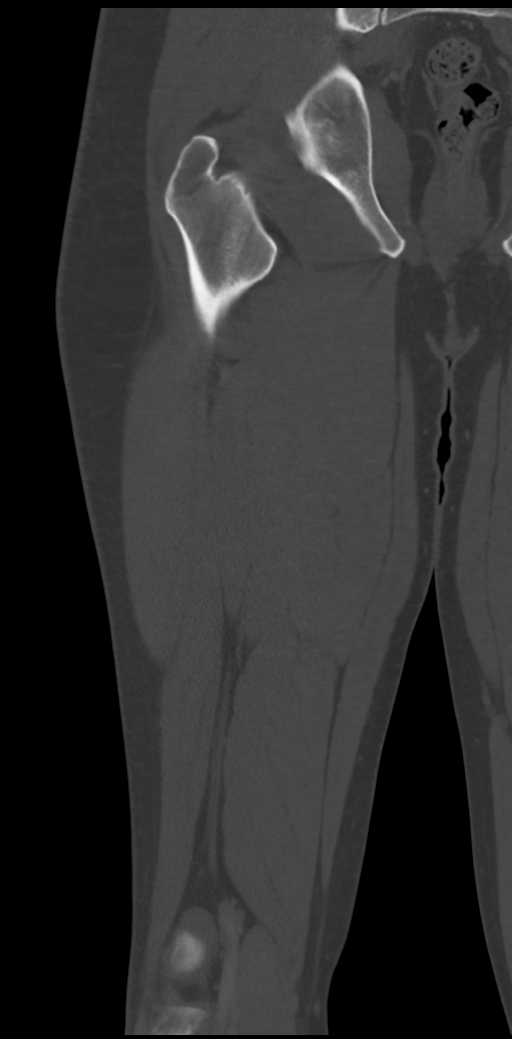

[Series 6: sagittal st · sagittal · 0.50mm/px · 5 of 57 slices shown]
[im 19/57  bone]
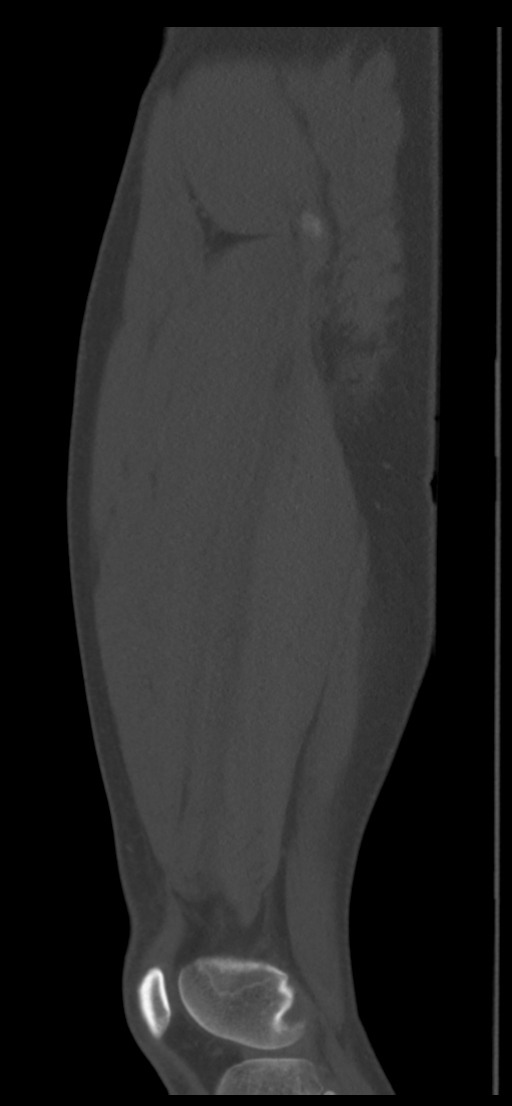
[im 24/57  bone]
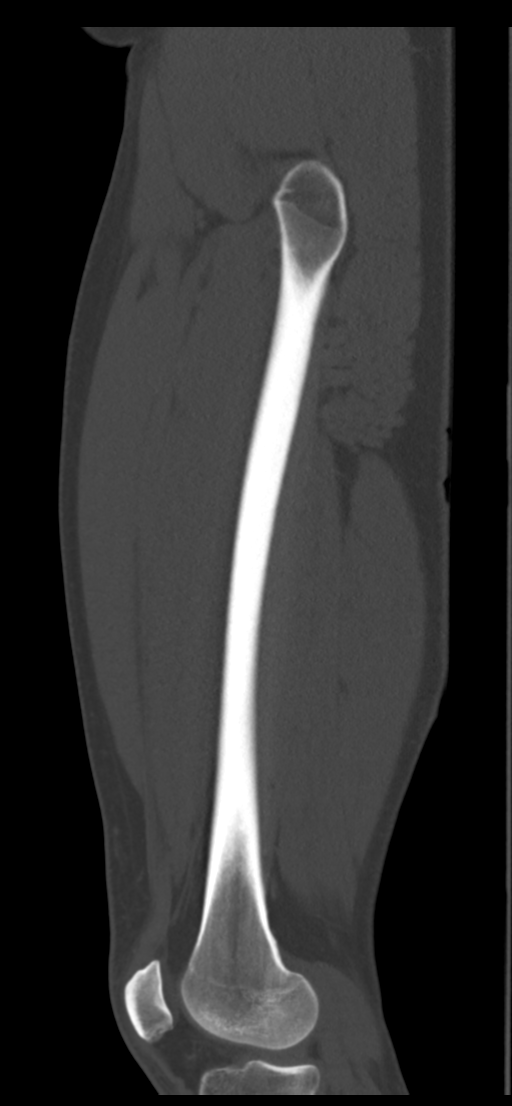
[im 29/57  bone]
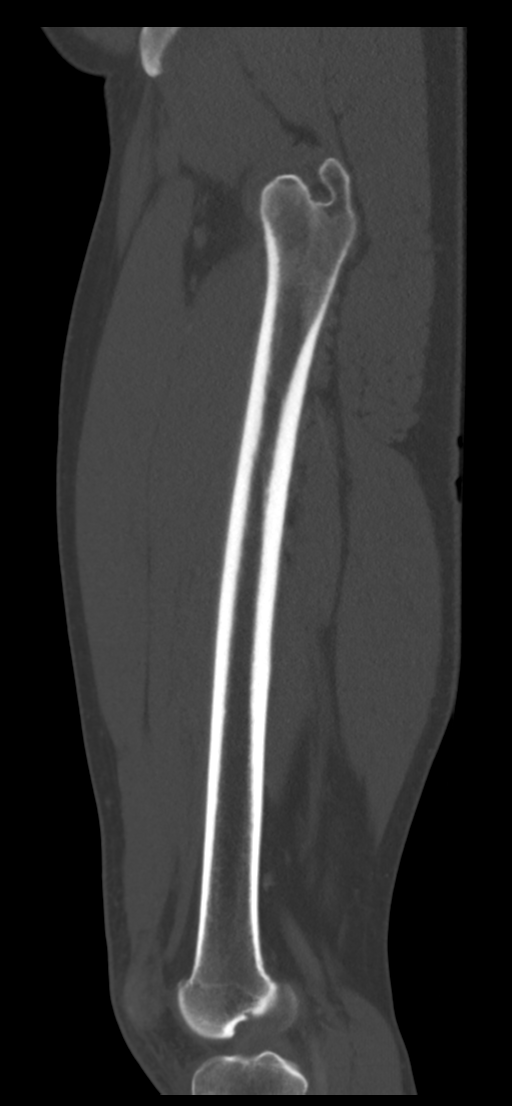
[im 33/57  bone]
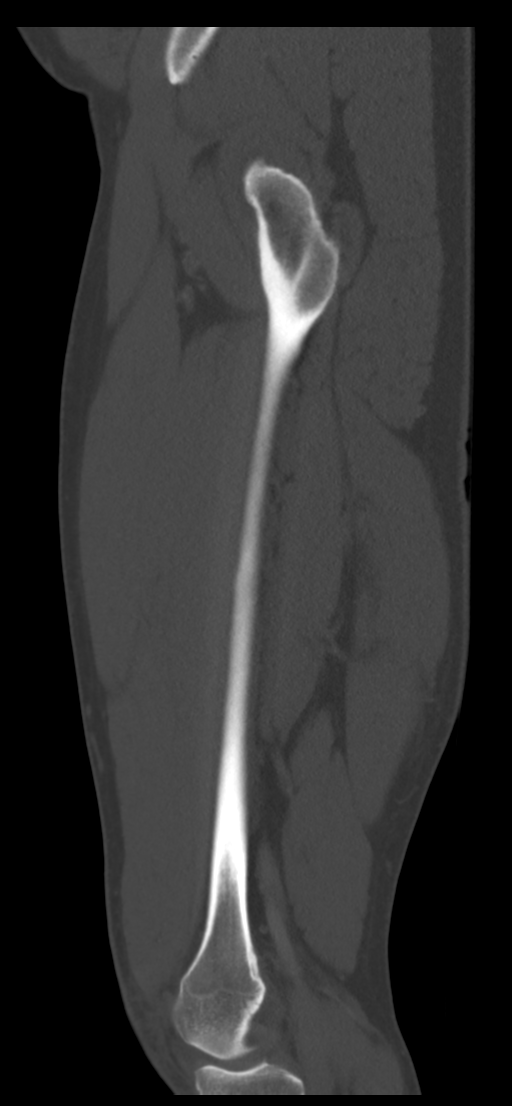
[im 38/57  bone]
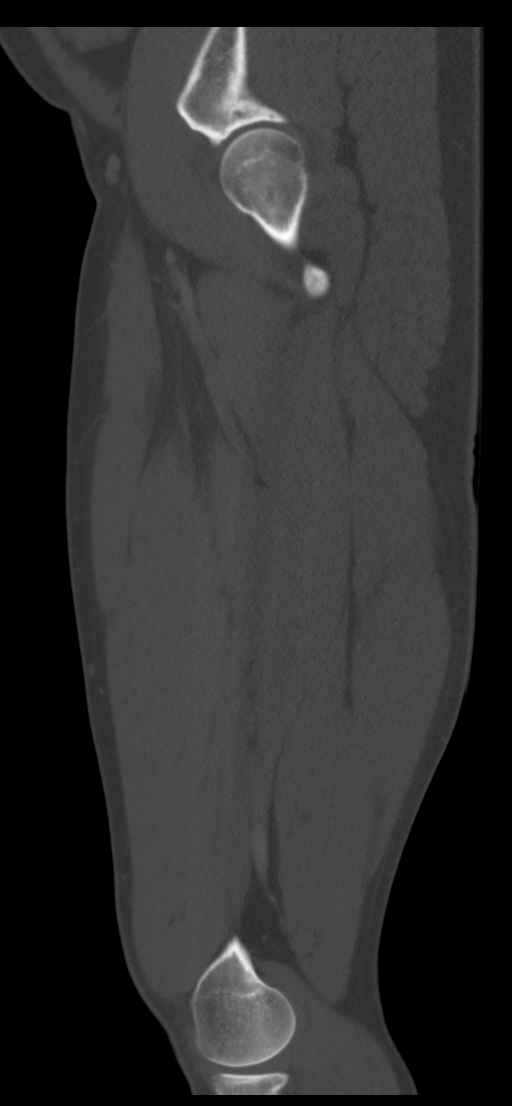

[10 of 33 positions shown; findings below may reference images not displayed]

FINDINGS: Bones/Joint/Cartilage

Cortical margins of the right femur are intact. No fracture or bony
destructive change. Small acetabula I about the anterior acetabulum.
Right hip joint is maintained. No evidence joint effusion. No knee
joint effusion.

Ligaments

Suboptimally assessed by CT.

Muscles and Tendons

Within or just deep to deep vastus lateralis muscle adjacent to the
proximal lateral femur is ill-defined 11 x 12 x 21 mm
fluid/low-density. No peripheral enhancement. No internal air. No
other intramuscular fluid. Normal fatty muscle striations persist.

Soft tissues

No subcutaneous edema. No inguinal adenopathy. Contrast primarily
within the arterial structures of the lower extremity, limited
assessment for venous filling defects due to phase of contrast.
IMPRESSION: 1. Small amount of ill-defined fluid within or just deep to the
vastus lateralis muscle adjacent of the proximal lateral femur,
nonspecific. This could represent fluid in the gluteal femoral
bursa. Myositis of infectious or inflammatory etiologies are also
considered, however no CT findings of adjacent muscle inflammation.
No peripheral enhancement to suggest abscess.
2. There is otherwise no explanation for right leg/inguinal pain. No
acute osseous abnormality of the right femur.

## 2020-01-12 ENCOUNTER — Encounter: Payer: Self-pay | Admitting: Emergency Medicine

## 2020-01-12 ENCOUNTER — Ambulatory Visit
Admission: EM | Admit: 2020-01-12 | Discharge: 2020-01-12 | Disposition: A | Discharge status: Home / Self Care | Payer: Self-pay | Attending: Emergency Medicine | Admitting: Emergency Medicine

## 2020-01-12 ENCOUNTER — Other Ambulatory Visit: Payer: Self-pay

## 2020-01-12 DIAGNOSIS — J028 Acute pharyngitis due to other specified organisms: Secondary | ICD-10-CM

## 2020-01-12 DIAGNOSIS — B9789 Other viral agents as the cause of diseases classified elsewhere: Secondary | ICD-10-CM

## 2020-01-12 DIAGNOSIS — R519 Headache, unspecified: Secondary | ICD-10-CM

## 2020-01-12 DIAGNOSIS — R197 Diarrhea, unspecified: Secondary | ICD-10-CM

## 2020-01-12 DIAGNOSIS — Z1152 Encounter for screening for COVID-19: Secondary | ICD-10-CM

## 2020-01-12 MED ORDER — CEPACOL REGULAR STRENGTH 3 MG MT LOZG: 1.0000 | LOZENGE | OROMUCOSAL | 0 refills | Status: AC | PRN

## 2020-01-12 NOTE — Discharge Instructions (Addendum)
COVID testing ordered.  It will take between 2-7 days for test results.  Someone will contact you regarding abnormal results.    In the meantime: You should remain isolated in your home for 10 days from symptom onset AND greater than 24 hours after symptoms resolution (absence of fever without the use of fever-reducing medication and improvement in respiratory symptoms), whichever is longer Get plenty of rest and push fluids Cepacol lozenges  for sore throat Use medications daily for symptom relief Use OTC medications like ibuprofen or tylenol as needed fever or pain Call or go to the ED if you have any new or worsening symptoms such as fever, worsening cough, shortness of breath, chest tightness, chest pain, turning blue, changes in mental status, etc

## 2020-01-12 NOTE — ED Provider Notes (Signed)
[facility]   [id number] 01/12/20 Arrival Time: 1210   CC: ABD discomfort  SUBJECTIVE: History from: patient.  Ross Mcgee is a 44 y.o. male who presents to the urgent care for complaint of headache, sore throat, and abdominal discomfort with diarrhea for the past few days.  Denies sick exposure to COVID, flu or strep.  Denies recent travel.  Has tried OTC medication without relief.  Denies aggravating factors.  Denies previous symptoms in the past.   Denies fever, chills, fatigue, sinus pain, rhinorrhea, sore throat, SOB, wheezing, chest pain, nausea, changes in bowel or bladder habits.    ROS: As per HPI.  All other pertinent ROS negative.     History reviewed. No pertinent past medical history. Past Surgical History:  Procedure Laterality Date   ABSCESS DRAINAGE     No Known Allergies No current facility-administered medications on file prior to encounter.   Current Outpatient Medications on File Prior to Encounter  Medication Sig Dispense Refill   amoxicillin (AMOXIL) 500 MG capsule Take 1 capsule (500 mg total) by mouth 3 (three) times daily. 30 capsule 0   cyclobenzaprine (FLEXERIL) 5 MG tablet Take 1 tablet (5 mg total) by mouth 3 (three) times daily as needed. 30 tablet 0   diclofenac (VOLTAREN) 50 MG EC tablet Take 1 tablet (50 mg total) by mouth 2 (two) times daily. 15 tablet 0   doxycycline (VIBRAMYCIN) 100 MG capsule Take 1 capsule (100 mg total) by mouth 2 (two) times daily. 14 capsule 0   HYDROcodone-acetaminophen (NORCO/VICODIN) 5-325 MG tablet Take 1 tablet by mouth every 6 (six) hours as needed. 10 tablet 0   hydrOXYzine (ATARAX/VISTARIL) 25 MG tablet 1 po q6h prn itching 15 tablet 0   naproxen (NAPROSYN) 500 MG tablet Take 1 tablet (500 mg total) by mouth 2 (two) times daily. 30 tablet 0   triamcinolone cream (KENALOG) 0.1 % Apply 1 application topically 2 (two) times daily. 30 g 1   Social History   Socioeconomic History   Marital  status: Single    Spouse name: Not on file   Number of children: Not on file   Years of education: Not on file   Highest education level: Not on file  Occupational History   Not on file  Tobacco Use   Smoking status: Current Some Day Smoker    Packs/day: 0.50    Types: Cigarettes   Smokeless tobacco: Never Used  Vaping Use   Vaping Use: Never used  Substance and Sexual Activity   Alcohol use: Yes    Comment: occ   Drug use: No    Types: Marijuana   Sexual activity: Not on file  Other Topics Concern   Not on file  Social History Narrative   Not on file   Social Determinants of Health   Financial Resource Strain:    Difficulty of Paying Living Expenses: Not on file  Food Insecurity:    Worried About Running Out of Food in the Last Year: Not on file   Ran Out of Food in the Last Year: Not on file  Transportation Needs:    Lack of Transportation (Medical): Not on file   Lack of Transportation (Non-Medical): Not on file  Physical Activity:    Days of Exercise per Week: Not on file   Minutes of Exercise per Session: Not on file  Stress:    Feeling of Stress : Not on file  Social Connections:    Frequency of Communication with Friends  and Family: Not on file   Frequency of Social Gatherings with Friends and Family: Not on file   Attends Religious Services: Not on file   Active Member of Clubs or Organizations: Not on file   Attends Banker Meetings: Not on file   Marital Status: Not on file  Intimate Partner Violence:    Fear of Current or Ex-Partner: Not on file   Emotionally Abused: Not on file   Physically Abused: Not on file   Sexually Abused: Not on file   No family history on file.  OBJECTIVE:  Vitals:   01/12/20 1225 01/12/20 1227  BP: 118/76   Pulse: 70   Resp: 19   Temp: 98.1 F (36.7 C)   TempSrc: Oral   SpO2: 98%   Weight:  219 lb (99.3 kg)  Height:  5\' 10"  (1.778 m)     General appearance: alert;  appears fatigued, but nontoxic; speaking in full sentences and tolerating own secretions HEENT: NCAT; Ears: EACs clear, TMs pearly gray; Eyes: PERRL.  EOM grossly intact. Sinuses: nontender; Nose: nares patent without rhinorrhea, Throat: oropharynx clear, tonsils non erythematous or enlarged, uvula midline  Neck: supple without LAD Lungs: unlabored respirations, symmetrical air entry; cough: absent; no respiratory distress; CTAB Heart: regular rate and rhythm.  Radial pulses 2+ symmetrical bilaterally Skin: warm and dry Psychological: alert and cooperative; normal mood and affect  LABS:  No results found for this or any previous visit (from the past 24 hour(s)).   ASSESSMENT & PLAN:  1. Encounter for screening for COVID-19   2. Sore throat (viral)   3. Acute nonintractable headache, unspecified headache type   4. Diarrhea, unspecified type     Meds ordered this encounter  Medications   menthol-cetylpyridinium (CEPACOL REGULAR STRENGTH) 3 MG lozenge    Sig: Take 1 lozenge (3 mg total) by mouth as needed for sore throat.    Dispense:  100 tablet    Refill:  0      COVID testing ordered.  It will take between 2-7 days for test results.  Someone will contact you regarding abnormal results.    In the meantime: You should remain isolated in your home for 10 days from symptom onset AND greater than 24 hours after symptoms resolution (absence of fever without the use of fever-reducing medication and improvement in respiratory symptoms), whichever is longer Get plenty of rest and push fluids Cepacol lozenges  for sore throat Use medications daily for symptom relief Use OTC medications like ibuprofen or tylenol as needed fever or pain Call or go to the ED if you have any new or worsening symptoms such as fever, worsening cough, shortness of breath, chest tightness, chest pain, turning blue, changes in mental status, etc...   Reviewed expectations re: course of current medical issues.  Questions answered. Outlined signs and symptoms indicating need for more acute intervention. Patient verbalized understanding. After Visit Summary given.         , FNP 01/12/20 1311

## 2020-01-12 NOTE — ED Triage Notes (Signed)
Diarrhea since Tuesday evening, has slowed down this morning.  Also reports headache and sore throat only when he first wakes up in the morning.

## 2020-01-13 LAB — NOVEL CORONAVIRUS, NAA: SARS-CoV-2, NAA: NOT DETECTED

## 2020-01-13 LAB — SARS-COV-2, NAA 2 DAY TAT

## 2020-05-12 ENCOUNTER — Encounter (HOSPITAL_COMMUNITY): Payer: Self-pay | Admitting: *Deleted

## 2020-05-12 ENCOUNTER — Other Ambulatory Visit: Payer: Self-pay

## 2020-05-12 ENCOUNTER — Ambulatory Visit (HOSPITAL_COMMUNITY)
Admission: EM | Admit: 2020-05-12 | Discharge: 2020-05-12 | Disposition: A | Discharge status: Home / Self Care | Payer: Self-pay | Attending: Emergency Medicine | Admitting: Emergency Medicine

## 2020-05-12 DIAGNOSIS — L03213 Periorbital cellulitis: Secondary | ICD-10-CM

## 2020-05-12 DIAGNOSIS — H6593 Unspecified nonsuppurative otitis media, bilateral: Secondary | ICD-10-CM

## 2020-05-12 DIAGNOSIS — R059 Cough, unspecified: Secondary | ICD-10-CM

## 2020-05-12 HISTORY — DX: Cutaneous abscess, unspecified: L02.91

## 2020-05-12 HISTORY — DX: Acne, unspecified: L70.9

## 2020-05-12 MED ORDER — CLINDAMYCIN HCL 300 MG PO CAPS: 300.0000 mg | ORAL_CAPSULE | Freq: Three times a day (TID) | ORAL | 0 refills | Status: AC

## 2020-05-12 MED ORDER — FLUTICASONE PROPIONATE 50 MCG/ACT NA SUSP: 1.0000 | Freq: Every day | NASAL | 2 refills | Status: AC

## 2020-05-12 MED ORDER — CETIRIZINE HCL 10 MG PO TABS: 10.0000 mg | ORAL_TABLET | Freq: Every day | ORAL | 0 refills | Status: AC

## 2020-05-12 MED ORDER — AMOXICILLIN-POT CLAVULANATE 875-125 MG PO TABS: 1.0000 | ORAL_TABLET | Freq: Two times a day (BID) | ORAL | 0 refills | Status: DC

## 2020-05-12 NOTE — Discharge Instructions (Addendum)
For infection around the eye, please take clindamycin 3 times daily for the next 7 days. For lungs and for eye, please take amoxicillin twice daily for the next 7 days. For your ear stuffiness, please take 1 spray of Flonase each side of nose daily for the next 10 days. Please take Zyrtec once daily before bed. Please pick up probiotic at pharmacy. If symptoms around the eye do not improve, please seek care at local emergency department as soon as possible.

## 2020-05-12 NOTE — ED Provider Notes (Signed)
[facility]  ____________________________________________  Time seen: Approximately 11:13 AM  I have reviewed the triage vital signs and the nursing notes.   HISTORY  Chief Complaint Cough   Historian Patient     HPI Ross Mcgee is a 45 y.o. male presents to the urgent care with multiple medical concerns.  Patient is primarily concerned with left-sided periorbital swelling that started with a region of folliculitis above left eyebrow.  Patient states that the swelling has been progressive.  He denies eye pain with movement or fever.  Denies any history of orbital cellulitis.  Patient is also secondarily concern for worsening, productive cough for green and brown sputum production.  Patient states that he had a viral URI 3 weeks ago that seem to be improving.  Patient states that over the past several days, his cough has seemed to have worsened.  He denies shortness of breath or chest tightness.  No wheezing at home.  Patient also has bilateral ear discomfort and states that he has noticed some changes in his hearing.  He has not been trying any alleviating medications at home.   Past Medical History:  Diagnosis Date  . Abscess   . Acne      Immunizations up to date:  Yes.     Past Medical History:  Diagnosis Date  . Abscess   . Acne     There are no problems to display for this patient.   Past Surgical History:  Procedure Laterality Date  . ABSCESS DRAINAGE      Prior to Admission medications   Medication Sig Start Date End Date Taking? Authorizing Provider  amoxicillin-clavulanate (AUGMENTIN) 875-125 MG tablet Take 1 tablet by mouth every 12 (twelve) hours. 05/12/20  Yes Pia Mau M, PA-C  cetirizine (ZYRTEC ALLERGY) 10 MG tablet Take 1 tablet (10 mg total) by mouth daily for 10 days. 05/12/20 05/22/20 Yes Pia Mau M, PA-C  clindamycin (CLEOCIN) 300 MG capsule Take 1 capsule (300 mg total) by mouth 3 (three) times daily for 7 days. 05/12/20  05/19/20 Yes Pia Mau M, PA-C  fluticasone (FLONASE) 50 MCG/ACT nasal spray Place 1 spray into both nostrils daily for 7 days. 05/12/20 05/19/20 Yes Pia Mau M, PA-C  hydrOXYzine (ATARAX/VISTARIL) 25 MG tablet 1 po q6h prn itching 12/15/15   Ivery Quale, PA-C  menthol-cetylpyridinium (CEPACOL REGULAR STRENGTH) 3 MG lozenge Take 1 lozenge (3 mg total) by mouth as needed for sore throat. 01/12/20   Avegno, Zachery Dakins, FNP  naproxen (NAPROSYN) 500 MG tablet Take 1 tablet (500 mg total) by mouth 2 (two) times daily. 02/16/17   Devoria Albe, MD  triamcinolone cream (KENALOG) 0.1 % Apply 1 application topically 2 (two) times daily. 12/15/15   Ivery Quale, PA-C    Allergies Other  Family History  Problem Relation Age of Onset  . Sarcoidosis Mother   . Healthy Father     Social History Social History   Tobacco Use  . Smoking status: Current Some Day Smoker    Packs/day: 0.50    Types: Cigarettes  . Smokeless tobacco: Never Used  Vaping Use  . Vaping Use: Never used  Substance Use Topics  . Alcohol use: Yes    Comment: occasionally  . Drug use: No    Types: Marijuana     Review of Systems  Constitutional: No fever/chills Eyes: Patient has left-sided periorbital edema. ENT: Patient has bilateral ear pain.  Respiratory: Patient has cough. Gastrointestinal:   No nausea, no vomiting.  No  diarrhea.  No constipation. Musculoskeletal: Negative for musculoskeletal pain. Skin: Negative for rash, abrasions, lacerations, ecchymosis.    ____________________________________________   PHYSICAL EXAM:  VITAL SIGNS: ED Triage Vitals  Enc Vitals Group     BP 05/12/20 1042 132/79     Pulse Rate 05/12/20 1042 73     Resp 05/12/20 1042 16     Temp 05/12/20 1042 98.4 F (36.9 C)     Temp Source 05/12/20 1042 Oral     SpO2 05/12/20 1042 97 %     Weight --      Height --      Head Circumference --      Peak Flow --      Pain Score 05/12/20 1044 2     Pain Loc --      Pain  Edu? --      Excl. in GC? --      Constitutional: Alert and oriented. Well appearing and in no acute distress. Eyes: Conjunctivae are normal. PERRL. EOMI. patient has left-sided periorbital edema.  Patient is able to keep his eyes open.  Region of folliculitis is visualized above left eyebrow. Head: Atraumatic. ENT:      Ears: TMs are effused bilaterally.      Nose: No congestion/rhinnorhea.      Mouth/Throat: Mucous membranes are moist.  Neck: No stridor.  {No cervical spine tenderness to palpation. Cardiovascular: Normal rate, regular rhythm. Normal S1 and S2.  Good peripheral circulation. Respiratory: Normal respiratory effort without tachypnea or retractions. Lungs CTAB. Good air entry to the bases with no decreased or absent breath sounds Skin:  Skin is warm, dry and intact. No rash noted. Psychiatric: Mood and affect are normal for age. Speech and behavior are normal.   ____________________________________________   LABS (all labs ordered are listed, but only abnormal results are displayed)  Labs Reviewed - No data to display ____________________________________________  EKG   ____________________________________________  RADIOLOGY   No results found.  ____________________________________________    PROCEDURES  Procedure(s) performed:     Procedures     Medications - No data to display   ____________________________________________   INITIAL IMPRESSION / ASSESSMENT AND PLAN / ED COURSE  Pertinent labs & imaging results that were available during my care of the patient were reviewed by me and considered in my medical decision making (see chart for details).      Assessment and plan Cough Periorbital cellulitis Middle ear effusion 45 year old male presents to the urgent care with multiple medical complaints.  Patient has left-sided periorbital edema concerning for early periorbital cellulitis.  We will start patient on clindamycin 3 times daily  for the next 7 days.  I am also concerned for early post viral pneumonia given patient's worsening cough 3 weeks after having a viral URI.  We will start patient on amoxicillin which will also cover patient for periorbital cellulitis.  For middle ear effusion, patient was started on Zyrtec once daily and Flonase.  Patient was cautioned that should his swelling around the left eye worsen, he should seek care at local emergency department for further care and management.  Also recommended patient picking up a probiotic given dual antibiotics.  All patient questions were answered.     ____________________________________________  FINAL CLINICAL IMPRESSION(S) / ED DIAGNOSES  Final diagnoses:  Cough  Periorbital cellulitis of left eye  Fluid level behind tympanic membrane of both ears      NEW MEDICATIONS STARTED DURING THIS VISIT:  ED Discharge Orders  Ordered    cetirizine (ZYRTEC ALLERGY) 10 MG tablet  Daily        05/12/20 1103    fluticasone (FLONASE) 50 MCG/ACT nasal spray  Daily        05/12/20 1103    amoxicillin-clavulanate (AUGMENTIN) 875-125 MG tablet  Every 12 hours        05/12/20 1103    clindamycin (CLEOCIN) 300 MG capsule  3 times daily        05/12/20 1103              This chart was dictated using voice recognition software/Dragon. Despite best efforts to proofread, errors can occur which can change the meaning. Any change was purely unintentional.     Orvil Feil, PA-C 05/12/20 1117

## 2020-05-12 NOTE — ED Triage Notes (Addendum)
Pt reports having cough, congestion, body aches, HA onset approx 3 wks ago; states had negative Covid test, and he felt much improved last wk; After returning to work 3 days ago, woke during night with productive cough, pimple/swelling to left lateral brow area, left eye redness and crusting, sensation of plugged left ear with sharp pains, sore throat, left frontal HA.

## 2020-11-22 ENCOUNTER — Ambulatory Visit
Admission: EM | Admit: 2020-11-22 | Discharge: 2020-11-22 | Disposition: A | Discharge status: Home / Self Care | Payer: Self-pay | Attending: Emergency Medicine | Admitting: Emergency Medicine

## 2020-11-22 ENCOUNTER — Encounter: Payer: Self-pay | Admitting: Emergency Medicine

## 2020-11-22 DIAGNOSIS — H5789 Other specified disorders of eye and adnexa: Secondary | ICD-10-CM

## 2020-11-22 MED ORDER — POLYMYXIN B-TRIMETHOPRIM 10000-0.1 UNIT/ML-% OP SOLN: OPHTHALMIC | 0 refills | Status: AC

## 2020-11-22 NOTE — ED Triage Notes (Signed)
Pt felt like he may have gotten something in his eye on Wednesday while mowing.  Since then eye has watered, had pus drainage and crusted together when he wakes up.  Does not feel like there is anything in his eye anymore.

## 2020-11-22 NOTE — ED Provider Notes (Signed)
[facility]   [id number] 11/22/20 Arrival Time: 1231  CC: Eye drainage  SUBJECTIVE:  Ross Mcgee is a 45 y.o. male who presents with complaint of getting something in eye a few days ago.  States he was cutting trees when something got in his eyes.  Has tried flushing eyes out at home with relief  Still admits to some clear drainage and crusting in the morning.   Denies aggravating factors.  Denies similar symptoms in the past.  Denies fever, chills, nausea, vomiting, eye pain, itching, vision changes, double vision, FB sensation, periorbital erythema.     Denies contact lens use.    ROS: As per HPI.  All other pertinent ROS negative.     Past Medical History:  Diagnosis Date   Abscess    Acne    Past Surgical History:  Procedure Laterality Date   ABSCESS DRAINAGE     Allergies  Allergen Reactions   Other     Topical cream for acne (reports facial swelling)   No current facility-administered medications on file prior to encounter.   Current Outpatient Medications on File Prior to Encounter  Medication Sig Dispense Refill   amoxicillin-clavulanate (AUGMENTIN) 875-125 MG tablet Take 1 tablet by mouth every 12 (twelve) hours. 14 tablet 0   cetirizine (ZYRTEC ALLERGY) 10 MG tablet Take 1 tablet (10 mg total) by mouth daily for 10 days. 30 tablet 0   fluticasone (FLONASE) 50 MCG/ACT nasal spray Place 1 spray into both nostrils daily for 7 days. 9.9 mL 2   hydrOXYzine (ATARAX/VISTARIL) 25 MG tablet 1 po q6h prn itching 15 tablet 0   menthol-cetylpyridinium (CEPACOL REGULAR STRENGTH) 3 MG lozenge Take 1 lozenge (3 mg total) by mouth as needed for sore throat. 100 tablet 0   naproxen (NAPROSYN) 500 MG tablet Take 1 tablet (500 mg total) by mouth 2 (two) times daily. 30 tablet 0   triamcinolone cream (KENALOG) 0.1 % Apply 1 application topically 2 (two) times daily. 30 g 1   Social History   Socioeconomic History   Marital status: Single    Spouse name: Not on file    Number of children: Not on file   Years of education: Not on file   Highest education level: Not on file  Occupational History   Not on file  Tobacco Use   Smoking status: Some Days    Packs/day: 0.50    Types: Cigarettes   Smokeless tobacco: Never  Vaping Use   Vaping Use: Never used  Substance and Sexual Activity   Alcohol use: Yes    Comment: occasionally   Drug use: No    Types: Marijuana   Sexual activity: Not on file  Other Topics Concern   Not on file  Social History Narrative   Not on file   Social Determinants of Health   Financial Resource Strain: Not on file  Food Insecurity: Not on file  Transportation Needs: Not on file  Physical Activity: Not on file  Stress: Not on file  Social Connections: Not on file  Intimate Partner Violence: Not on file   Family History  Problem Relation Age of Onset   Sarcoidosis Mother    Healthy Father     OBJECTIVE:   Vitals:   11/22/20 1249  BP: (!) 158/79  Pulse: 77  Resp: 16  Temp: (!) 97.3 F (36.3 C)  TempSrc: Tympanic  SpO2: 94%    General appearance: alert; no distress Eyes: No conjunctival erythema. PERRL; EOMI without  discomfort;  trace clear obvious drainage; lid everted without obvious FB; declines fluorescein eye exam Neck: supple Lungs: normal respiratory effort Skin: warm and dry Psychological: alert and cooperative; normal mood and affect   ASSESSMENT & PLAN:  1. Eye drainage     Meds ordered this encounter  Medications   trimethoprim-polymyxin b (POLYTRIM) ophthalmic solution    Sig: instill 1 drop of polymyxin B sulfate and trimethoprim sulfate ophthalmic solution (polymyxin B 93734 units/trimethoprim 1 mg per mL) to affected eye(s) every 3 hours for 7 to 10 days    Dispense:  10 mL    Refill:  0    Order Specific Question:   Supervising Provider    Answer:   Eustace Moore [[id number]]   Use polytrim eye drops as prescribed and to completion Use OTC systane or genteal gel eye drops  at night as needed for symptomatic relief Use OTC ibuprofen or tylenol as needed for pain relief Return here or follow up with ophthamolgy if symptoms persists or worsen such as fever, chills, redness, swelling, eye pain, painful eye movements, vision changes, etc...  Reviewed expectations re: course of current medical issues. Questions answered. Outlined signs and symptoms indicating need for more acute intervention. Patient verbalized understanding. After Visit Summary given.    Rennis Harding, PA-C 11/22/20 1332

## 2020-11-22 NOTE — Discharge Instructions (Addendum)
Use polytrim eye drops as prescribed and to completion Use OTC systane or genteal gel eye drops at night as needed for symptomatic relief Use OTC ibuprofen or tylenol as needed for pain relief Return here or follow up with ophthamolgy if symptoms persists or worsen such as fever, chills, redness, swelling, eye pain, painful eye movements, vision changes, etc... 

## 2021-04-14 ENCOUNTER — Emergency Department (HOSPITAL_COMMUNITY)
Admission: EM | Admit: 2021-04-14 | Discharge: 2021-04-14 | Disposition: A | Discharge status: Home / Self Care | Payer: Medicaid Other | Attending: Emergency Medicine | Admitting: Emergency Medicine

## 2021-04-14 DIAGNOSIS — L509 Urticaria, unspecified: Secondary | ICD-10-CM | POA: Diagnosis not present

## 2021-04-14 DIAGNOSIS — R21 Rash and other nonspecific skin eruption: Secondary | ICD-10-CM | POA: Diagnosis present

## 2021-04-14 DIAGNOSIS — F17210 Nicotine dependence, cigarettes, uncomplicated: Secondary | ICD-10-CM | POA: Insufficient documentation

## 2021-04-14 LAB — CBC
HCT: 45.7 % (ref 39.0–52.0)
Hemoglobin: 14.8 g/dL (ref 13.0–17.0)
MCH: 32.9 pg (ref 26.0–34.0)
MCHC: 32.4 g/dL (ref 30.0–36.0)
MCV: 101.6 fL — ABNORMAL HIGH (ref 80.0–100.0)
Platelets: 447 10*3/uL — ABNORMAL HIGH (ref 150–400)
RBC: 4.5 MIL/uL (ref 4.22–5.81)
RDW: 12.7 % (ref 11.5–15.5)
WBC: 8.7 10*3/uL (ref 4.0–10.5)
nRBC: 0 % (ref 0.0–0.2)

## 2021-04-14 LAB — BASIC METABOLIC PANEL
Anion gap: 8 (ref 5–15)
BUN: 10 mg/dL (ref 6–20)
CO2: 28 mmol/L (ref 22–32)
Calcium: 8.7 mg/dL — ABNORMAL LOW (ref 8.9–10.3)
Chloride: 100 mmol/L (ref 98–111)
Creatinine, Ser: 1.07 mg/dL (ref 0.61–1.24)
GFR, Estimated: >60 mL/min (ref >60)
Glucose, Bld: 92 mg/dL (ref 70–99)
Potassium: 3.5 mmol/L (ref 3.5–5.1)
Sodium: 136 mmol/L (ref 135–145)

## 2021-04-14 MED ORDER — METHYLPREDNISOLONE SODIUM SUCC 125 MG IJ SOLR
80.0000 mg | Freq: Once | INTRAMUSCULAR | Status: AC
  Administered 2021-04-14: 13:00:00 80 mg via INTRAVENOUS
  Filled 2021-04-14: qty 2

## 2021-04-14 MED ORDER — PREDNISONE 20 MG PO TABS: 40.0000 mg | ORAL_TABLET | Freq: Every day | ORAL | 0 refills | Status: AC

## 2021-04-14 MED ORDER — DIPHENHYDRAMINE HCL 25 MG PO TABS: 25.0000 mg | ORAL_TABLET | Freq: Four times a day (QID) | ORAL | 0 refills | Status: AC | PRN

## 2021-04-14 MED ORDER — DIPHENHYDRAMINE HCL 50 MG/ML IJ SOLN
25.0000 mg | Freq: Once | INTRAMUSCULAR | Status: AC
  Administered 2021-04-14: 13:00:00 25 mg via INTRAVENOUS
  Filled 2021-04-14: qty 1

## 2021-04-14 NOTE — ED Triage Notes (Signed)
Pt to ED via triage c/o ongoing red rash to back and abdomen, started 1 month ago, progressively getting worse. Reports using hydrocortisone cream with no relief.  Reports changing laundry soap around when symptoms started, still currently using.

## 2021-04-14 NOTE — ED Provider Notes (Signed)
[facility] EMERGENCY DEPARTMENT Provider Note   CSN: [id number] Arrival date & time: 04/14/21  1004     History Chief Complaint  Patient presents with   Rash    Ross Mcgee is a 45 y.o. male.  45 year old male presents today for evaluation of generalized rash over his body that has been present for about 3 to 4 weeks.  Patient reports started on his legs and has spread since then.  Patient reports he has changed his laundry detergent, removed himself from from his dog and still has had no improvement.  He has tried hydrocortisone cream without significant improvement.  Denies throat swelling, difficulty speaking, difficulty swallowing, difficulty breathing, involvement of his eyes.  He is without fever, chills, nausea, history of bleeding disorders.  The history is provided by the patient. No language interpreter was used.      Past Medical History:  Diagnosis Date   Abscess    Acne     There are no problems to display for this patient.   Past Surgical History:  Procedure Laterality Date   ABSCESS DRAINAGE         Family History  Problem Relation Age of Onset   Sarcoidosis Mother    Healthy Father     Social History   Tobacco Use   Smoking status: Some Days    Packs/day: 0.50    Types: Cigarettes   Smokeless tobacco: Never  Vaping Use   Vaping Use: Never used  Substance Use Topics   Alcohol use: Yes    Comment: occasionally   Drug use: No    Types: Marijuana    Home Medications Prior to Admission medications   Medication Sig Start Date End Date Taking? Authorizing Provider  diphenhydrAMINE (BENADRYL) 25 MG tablet Take 1 tablet (25 mg total) by mouth every 6 (six) hours as needed. 04/14/21  Yes Tyleigh Mahn, PA-C  predniSONE (DELTASONE) 20 MG tablet Take 2 tablets (40 mg total) by mouth daily with breakfast for 5 days. 04/14/21 04/19/21 Yes Kelci Petrella, PA-C  amoxicillin-clavulanate (AUGMENTIN) 875-125 MG tablet Take 1 tablet by mouth every 12  (twelve) hours. 05/12/20   Lannie Fields, PA-C  cetirizine (ZYRTEC ALLERGY) 10 MG tablet Take 1 tablet (10 mg total) by mouth daily for 10 days. 05/12/20 05/22/20  Lannie Fields, PA-C  fluticasone (FLONASE) 50 MCG/ACT nasal spray Place 1 spray into both nostrils daily for 7 days. 05/12/20 05/19/20  Lannie Fields, PA-C  hydrOXYzine (ATARAX/VISTARIL) 25 MG tablet 1 po q6h prn itching 12/15/15   Lily Kocher, PA-C  menthol-cetylpyridinium (CEPACOL REGULAR STRENGTH) 3 MG lozenge Take 1 lozenge (3 mg total) by mouth as needed for sore throat. 01/12/20   Avegno, Darrelyn Hillock, FNP  naproxen (NAPROSYN) 500 MG tablet Take 1 tablet (500 mg total) by mouth 2 (two) times daily. 02/16/17   Rolland Porter, MD  triamcinolone cream (KENALOG) 0.1 % Apply 1 application topically 2 (two) times daily. 12/15/15   Lily Kocher, PA-C  trimethoprim-polymyxin b (POLYTRIM) ophthalmic solution instill 1 drop of polymyxin B sulfate and trimethoprim sulfate ophthalmic solution (polymyxin B 10000 units/trimethoprim 1 mg per mL) to affected eye(s) every 3 hours for 7 to 10 days 11/22/20   Stacey Drain, [place], PA-C    Allergies    Other  Review of Systems   Review of Systems  Constitutional:  Negative for chills and fever.  Eyes:  Negative for visual disturbance.  Respiratory:  Negative for shortness of breath.   Gastrointestinal:  Negative  for nausea and vomiting.  Skin:  Positive for rash.  Neurological:  Negative for syncope, light-headedness and headaches.  All other systems reviewed and are negative.  Physical Exam Updated Vital Signs BP 135/80 (BP Location: Right Arm)    Pulse 89    Temp 99 F (37.2 C) (Oral)    Resp 18    Ht 5\' 10"  (1.778 m)    Wt 95.3 kg    SpO2 98%    BMI 30.13 kg/m   Physical Exam  ED Results / Procedures / Treatments   Labs (all labs ordered are listed, but only abnormal results are displayed) Labs Reviewed  CBC - Abnormal; Notable for the following components:      Result Value   MCV 101.6 (*)     Platelets 447 (*)    All other components within normal limits  BASIC METABOLIC PANEL - Abnormal; Notable for the following components:   Calcium 8.7 (*)    All other components within normal limits    EKG None  Radiology No results found.  Procedures Procedures   Medications Ordered in ED Medications  diphenhydrAMINE (BENADRYL) injection 25 mg (25 mg Intravenous Given 04/14/21 1326)  methylPREDNISolone sodium succinate (SOLU-MEDROL) 125 mg/2 mL injection 80 mg (80 mg Intravenous Given 04/14/21 1325)    ED Course  I have reviewed the triage vital signs and the nursing notes.  Pertinent labs & imaging results that were available during my care of the patient were reviewed by me and considered in my medical decision making (see chart for details).    MDM Rules/Calculators/A&P                         45 year old male presents today for evaluation of rash which is been present for about 3 to 4 weeks and is spreading.  Rash is consistent with urticaria given wheal like collections throughout.  Otherwise without symptoms of angioedema or anaphylaxis.  Patient given Benadryl and Solu-Medrol in the emergency room.  Discussed prednisone course and Benadryl over the next several days.  Patient while in the emergency room having IV placed had an episode of lightheadedness, diaphoresis and feeling flushed.  Resolved right after without episode of syncope.  Does not have history of any cardiac problems, prior similar episodes, or episodes since having this episode earlier in the emergency rooms visit.  Blood work without acute concerns.  Patient is appropriate for discharge.  Return precautions discussed.  Patient voices understanding and is in agreement with plan.    Final Clinical Impression(s) / ED Diagnoses Final diagnoses:  Urticaria    Rx / DC Orders ED Discharge Orders          Ordered    predniSONE (DELTASONE) 20 MG tablet  Daily with breakfast        04/14/21 1458     diphenhydrAMINE (BENADRYL) 25 MG tablet  Every 6 hours PRN        04/14/21 1458             04/16/21, PA-C 04/14/21 1502    04/16/21, MD 04/14/21 1909

## 2021-04-14 NOTE — Discharge Instructions (Signed)
You received Benadryl and steroids in the emergency room.  You likely have an allergic reaction.  Your rash is consistent with this.  If you do develop swelling in your throat, difficulty swallowing or breathing please return to the emergency room.  The episode of lightheadedness you had in the emergency room likely consistent with what we vasovagal reaction given it was while you are having an IV placed.  Your blood work is reassuring.  Given that you had no prior episodes and no episodes since I do not think there is a serious cause behind this episode.  If you have another episode like this or you have an episode of passing out please return immediately to your nearest emergency room for evaluation.

## 2021-04-22 ENCOUNTER — Encounter (HOSPITAL_COMMUNITY): Payer: Self-pay | Admitting: Emergency Medicine

## 2021-04-22 ENCOUNTER — Emergency Department (HOSPITAL_COMMUNITY)
Admission: EM | Admit: 2021-04-22 | Discharge: 2021-04-23 | Discharge status: Against Medical Advice | Payer: BC Managed Care – PPO | Attending: Emergency Medicine | Admitting: Emergency Medicine

## 2021-04-22 ENCOUNTER — Other Ambulatory Visit: Payer: Self-pay

## 2021-04-22 DIAGNOSIS — L509 Urticaria, unspecified: Secondary | ICD-10-CM | POA: Diagnosis not present

## 2021-04-22 DIAGNOSIS — Z5321 Procedure and treatment not carried out due to patient leaving prior to being seen by health care provider: Secondary | ICD-10-CM | POA: Diagnosis not present

## 2021-04-22 DIAGNOSIS — R21 Rash and other nonspecific skin eruption: Secondary | ICD-10-CM | POA: Diagnosis present

## 2021-04-22 LAB — CBC WITH DIFFERENTIAL/PLATELET
Abs Immature Granulocytes: 0.08 10*3/uL — ABNORMAL HIGH (ref 0.00–0.07)
Basophils Absolute: 0 10*3/uL (ref 0.0–0.1)
Basophils Relative: 0 %
Eosinophils Absolute: 0.3 10*3/uL (ref 0.0–0.5)
Eosinophils Relative: 4 %
HCT: 43.9 % (ref 39.0–52.0)
Hemoglobin: 14.3 g/dL (ref 13.0–17.0)
Immature Granulocytes: 1 %
Lymphocytes Relative: 35 %
Lymphs Abs: 2.9 10*3/uL (ref 0.7–4.0)
MCH: 32.9 pg (ref 26.0–34.0)
MCHC: 32.6 g/dL (ref 30.0–36.0)
MCV: 100.9 fL — ABNORMAL HIGH (ref 80.0–100.0)
Monocytes Absolute: 0.4 10*3/uL (ref 0.1–1.0)
Monocytes Relative: 5 %
Neutro Abs: 4.5 10*3/uL (ref 1.7–7.7)
Neutrophils Relative %: 55 %
Platelets: 455 10*3/uL — ABNORMAL HIGH (ref 150–400)
RBC: 4.35 MIL/uL (ref 4.22–5.81)
RDW: 12.6 % (ref 11.5–15.5)
WBC: 8.1 10*3/uL (ref 4.0–10.5)
nRBC: 0 % (ref 0.0–0.2)

## 2021-04-22 LAB — HIV ANTIBODY (ROUTINE TESTING W REFLEX): HIV Screen 4th Generation wRfx: NONREACTIVE

## 2021-04-22 LAB — BASIC METABOLIC PANEL
Anion gap: 8 (ref 5–15)
BUN: 13 mg/dL (ref 6–20)
CO2: 26 mmol/L (ref 22–32)
Calcium: 8.8 mg/dL — ABNORMAL LOW (ref 8.9–10.3)
Chloride: 103 mmol/L (ref 98–111)
Creatinine, Ser: 1.07 mg/dL (ref 0.61–1.24)
GFR, Estimated: >60 mL/min (ref >60)
Glucose, Bld: 104 mg/dL — ABNORMAL HIGH (ref 70–99)
Potassium: 4.1 mmol/L (ref 3.5–5.1)
Sodium: 137 mmol/L (ref 135–145)

## 2021-04-22 NOTE — ED Triage Notes (Signed)
Patient reports persistent itchy skin rashes / hives for 2 weeks unrelieved by prescription oral steroids , airway intact /no oral swelling , respirations unlabored/denies fever .

## 2021-04-22 NOTE — ED Provider Notes (Signed)
Emergency Medicine Provider Triage Evaluation Note  Ross Mcgee , a 46 y.o. male  was evaluated in triage.  Pt complains of who presents for reevaluation of a rash.  Patient diagnosed with urticaria on 12/26.  He took prednisone with improvement.  Soon as the prednisone stopped he has recurrence of his symptoms.  He has erythematous raised wheals but painful lesions on the soles and palms.  No new medication.  No airway involvement  Review of Systems  Positive: RASH Negative: FEVER  Physical Exam  There were no vitals taken for this visit. Gen:   Awake, no distress   Resp:  Normal effort  MSK:   Moves extremities without difficulty  Other:  Raised wheals over the arms legs face palms and soles  Medical Decision Making  Medically screening exam initiated at 7:32 PM.  Appropriate orders placed.  Beren Yniguez Lofland was informed that the remainder of the evaluation will be completed by another provider, this initial triage assessment does not replace that evaluation, and the importance of remaining in the ED until their evaluation is complete.  Labs, HIV and RPR ordered.   Arthor Captain, PA-C 04/22/21 1934    Glendora Score, MD 04/23/21 0020

## 2021-04-23 LAB — RPR: RPR Ser Ql: NONREACTIVE

## 2021-04-23 NOTE — ED Notes (Signed)
X2 vitals recheck with no response 

## 2021-07-02 ENCOUNTER — Encounter (HOSPITAL_BASED_OUTPATIENT_CLINIC_OR_DEPARTMENT_OTHER): Payer: Self-pay

## 2021-07-02 ENCOUNTER — Emergency Department (HOSPITAL_BASED_OUTPATIENT_CLINIC_OR_DEPARTMENT_OTHER)
Admission: EM | Admit: 2021-07-02 | Discharge: 2021-07-02 | Disposition: A | Discharge status: Home / Self Care | Payer: Medicaid Other | Attending: Emergency Medicine | Admitting: Emergency Medicine

## 2021-07-02 ENCOUNTER — Other Ambulatory Visit: Payer: Self-pay

## 2021-07-02 DIAGNOSIS — M545 Low back pain, unspecified: Secondary | ICD-10-CM | POA: Insufficient documentation

## 2021-07-02 DIAGNOSIS — X500XXA Overexertion from strenuous movement or load, initial encounter: Secondary | ICD-10-CM | POA: Insufficient documentation

## 2021-07-02 MED ORDER — METHOCARBAMOL 500 MG PO TABS
500.0000 mg | ORAL_TABLET | Freq: Four times a day (QID) | ORAL | 0 refills | Status: AC
Start: 2021-07-02 — End: ?

## 2021-07-02 MED ORDER — DICLOFENAC SODIUM 75 MG PO TBEC: 75.0000 mg | DELAYED_RELEASE_TABLET | Freq: Two times a day (BID) | ORAL | 0 refills | Status: AC

## 2021-07-02 NOTE — ED Triage Notes (Signed)
Patient here POV from Home with Back Pain. ? ?States it began 2 days ago when he awoke with the Pain. Pain is Mid Back and radiates to Bilateral Lower Back. ? ?Patient does not recall any Specific Trauma or Injury but does Lift items. No Urinary Symptoms.  ? ?NAD Noted during Triage. A&Ox4. GCS 15. Ambulatory. ?

## 2021-07-02 NOTE — ED Provider Notes (Signed)
?[facility] EMERGENCY DEPT ?Provider Note ? ? ?CSN: [id number] ?Arrival date & time: 07/02/21  1100 ? ?  ? ?History ? ?Chief Complaint  ?Patient presents with  ? Back Pain  ? ? ?Ross Mcgee is a 46 y.o. male. ? ?The history is provided by the patient. No language interpreter was used.  ?Back Pain ?Location:  Lumbar spine ?Quality:  Aching ?Radiates to:  Does not radiate ?Pain severity:  Moderate ?Pain is:  Worse during the day ?Onset quality:  Gradual ?Duration:  3 days ?Timing:  Constant ?Progression:  Worsening ?Chronicity:  New ?Context: lifting heavy objects   ?Relieved by:  Nothing ?Worsened by:  Nothing ?Ineffective treatments:  None tried ?Associated symptoms: no fever and no weight loss   ? ?  ? ?Home Medications ?Prior to Admission medications   ?Medication Sig Start Date End Date Taking? Authorizing Provider  ?diclofenac (VOLTAREN) 75 MG EC tablet Take 1 tablet (75 mg total) by mouth 2 (two) times daily. 07/02/21  Yes Cheron Schaumann K, PA-C  ?methocarbamol (ROBAXIN) 500 MG tablet Take 1 tablet (500 mg total) by mouth 4 (four) times daily. 07/02/21  Yes Cheron Schaumann K, PA-C  ?amoxicillin-clavulanate (AUGMENTIN) 875-125 MG tablet Take 1 tablet by mouth every 12 (twelve) hours. 05/12/20   Orvil Feil, PA-C  ?cetirizine (ZYRTEC ALLERGY) 10 MG tablet Take 1 tablet (10 mg total) by mouth daily for 10 days. 05/12/20 05/22/20  Orvil Feil, PA-C  ?diphenhydrAMINE (BENADRYL) 25 MG tablet Take 1 tablet (25 mg total) by mouth every 6 (six) hours as needed. 04/14/21   Marita Kansas, PA-C  ?fluticasone (FLONASE) 50 MCG/ACT nasal spray Place 1 spray into both nostrils daily for 7 days. 05/12/20 05/19/20  Orvil Feil, PA-C  ?hydrOXYzine (ATARAX/VISTARIL) 25 MG tablet 1 po q6h prn itching 12/15/15   Ivery Quale, PA-C  ?menthol-cetylpyridinium (CEPACOL REGULAR STRENGTH) 3 MG lozenge Take 1 lozenge (3 mg total) by mouth as needed for sore throat. 01/12/20   Avegno, Zachery Dakins, FNP  ?naproxen (NAPROSYN) 500  MG tablet Take 1 tablet (500 mg total) by mouth 2 (two) times daily. 02/16/17   Devoria Albe, MD  ?triamcinolone cream (KENALOG) 0.1 % Apply 1 application topically 2 (two) times daily. 12/15/15   Ivery Quale, PA-C  ?trimethoprim-polymyxin b (POLYTRIM) ophthalmic solution instill 1 drop of polymyxin B sulfate and trimethoprim sulfate ophthalmic solution (polymyxin B 96789 units/trimethoprim 1 mg per mL) to affected eye(s) every 3 hours for 7 to 10 days 11/22/20   Alvino Chapel [place], PA-C  ?   ? ?Allergies    ?Other   ? ?Review of Systems   ?Review of Systems  ?Constitutional:  Negative for fever and weight loss.  ?Musculoskeletal:  Positive for back pain.  ?All other systems reviewed and are negative. ? ?Physical Exam ?Updated Vital Signs ?BP (!) 141/99 (BP Location: Right Arm)   Pulse 70   Temp 97.8 ?F (36.6 ?C)   Resp 18   Ht 5\' 10"  (1.778 m)   Wt 99.8 kg   SpO2 97%   BMI 31.57 kg/m?  ?Physical Exam ?Vitals and nursing note reviewed.  ?Constitutional:   ?   Appearance: He is well-developed.  ?HENT:  ?   Head: Normocephalic.  ?Cardiovascular:  ?   Rate and Rhythm: Normal rate.  ?Pulmonary:  ?   Effort: Pulmonary effort is normal.  ?Abdominal:  ?   General: Abdomen is flat. There is no distension.  ?Musculoskeletal:     ?   General:  Normal range of motion.  ?   Cervical back: Normal range of motion.  ?   Comments: Tender mid lateral lumbar area,  normal reflexes,  nv and ns intact   ?Skin: ?   General: Skin is warm.  ?Neurological:  ?   General: No focal deficit present.  ?   Mental Status: He is alert and oriented to person, place, and time.  ?Psychiatric:     ?   Mood and Affect: Mood normal.  ? ? ?ED Results / Procedures / Treatments   ?Labs ?(all labs ordered are listed, but only abnormal results are displayed) ?Labs Reviewed - No data to display ? ?EKG ?None ? ?Radiology ?No results found. ? ?Procedures ?Procedures  ? ? ?Medications Ordered in ED ?Medications - No data to display ? ?ED Course/ Medical  Decision Making/ A&P ?  ?                        ?Medical Decision Making ?Risk ?Prescription drug management. ? ? ? ? ? ? ? ? ? ? ?Final Clinical Impression(s) / ED Diagnoses ?Final diagnoses:  ?Acute low back pain, unspecified back pain laterality, unspecified whether sciatica present  ?MDM:  Pt had pain after lifting,  no radicular symptoms,  I doubt HNP.  Symptoms seem muscular.  Pt given rx for voltaren  and robaxin.   ? ?Rx / DC Orders ?ED Discharge Orders   ? ?      Ordered  ?  diclofenac (VOLTAREN) 75 MG EC tablet  2 times daily       ? 07/02/21 1226  ?  methocarbamol (ROBAXIN) 500 MG tablet  4 times daily       ? 07/02/21 1226  ? ?  ?  ? ?  ? ?An After Visit Summary was printed and given to the patient.  ?  ?Elson Areas, PA-C ?07/02/21 1236 ? ?  ?Maia Plan, MD ?07/08/21 2316 ? ?

## 2021-07-02 NOTE — ED Notes (Signed)
EMT-P provided AVS using Teachback Method. Patient verbalizes understanding of Discharge Instructions. Opportunity for Questioning and Answers were provided by EMT-P. Patient Discharged from ED.  ? ?

## 2021-07-02 NOTE — Discharge Instructions (Addendum)
Return if any problems.

## 2022-01-12 ENCOUNTER — Other Ambulatory Visit: Payer: Self-pay

## 2022-01-12 ENCOUNTER — Other Ambulatory Visit (HOSPITAL_BASED_OUTPATIENT_CLINIC_OR_DEPARTMENT_OTHER): Payer: Self-pay

## 2022-01-12 ENCOUNTER — Emergency Department (HOSPITAL_BASED_OUTPATIENT_CLINIC_OR_DEPARTMENT_OTHER)
Admission: EM | Admit: 2022-01-12 | Discharge: 2022-01-12 | Disposition: A | Discharge status: Home / Self Care | Payer: Medicaid Other | Attending: Emergency Medicine | Admitting: Emergency Medicine

## 2022-01-12 ENCOUNTER — Encounter (HOSPITAL_BASED_OUTPATIENT_CLINIC_OR_DEPARTMENT_OTHER): Payer: Self-pay

## 2022-01-12 ENCOUNTER — Emergency Department (HOSPITAL_BASED_OUTPATIENT_CLINIC_OR_DEPARTMENT_OTHER): Payer: Medicaid Other

## 2022-01-12 ENCOUNTER — Emergency Department (HOSPITAL_COMMUNITY)
Admission: EM | Admit: 2022-01-12 | Discharge: 2022-01-12 | Discharge status: Against Medical Advice | Payer: Medicaid Other

## 2022-01-12 DIAGNOSIS — K648 Other hemorrhoids: Secondary | ICD-10-CM | POA: Diagnosis not present

## 2022-01-12 DIAGNOSIS — K6289 Other specified diseases of anus and rectum: Secondary | ICD-10-CM | POA: Diagnosis present

## 2022-01-12 DIAGNOSIS — I700 Atherosclerosis of aorta: Secondary | ICD-10-CM | POA: Insufficient documentation

## 2022-01-12 LAB — BASIC METABOLIC PANEL WITH GFR
Anion gap: 8 (ref 5–15)
BUN: 11 mg/dL (ref 6–20)
CO2: 31 mmol/L (ref 22–32)
Calcium: 9.9 mg/dL (ref 8.9–10.3)
Chloride: 100 mmol/L (ref 98–111)
Creatinine, Ser: 1.05 mg/dL (ref 0.61–1.24)
GFR, Estimated: >60 mL/min
Glucose, Bld: 61 mg/dL — ABNORMAL LOW (ref 70–99)
Potassium: 3.6 mmol/L (ref 3.5–5.1)
Sodium: 139 mmol/L (ref 135–145)

## 2022-01-12 LAB — CBC
HCT: 48 % (ref 39.0–52.0)
Hemoglobin: 16 g/dL (ref 13.0–17.0)
MCH: 33.2 pg (ref 26.0–34.0)
MCHC: 33.3 g/dL (ref 30.0–36.0)
MCV: 99.6 fL (ref 80.0–100.0)
Platelets: 368 K/uL (ref 150–400)
RBC: 4.82 MIL/uL (ref 4.22–5.81)
RDW: 12.4 % (ref 11.5–15.5)
WBC: 5.6 K/uL (ref 4.0–10.5)
nRBC: 0 % (ref 0.0–0.2)

## 2022-01-12 MED ORDER — IOHEXOL 300 MG/ML  SOLN
100.0000 mL | Freq: Once | INTRAMUSCULAR | Status: AC | PRN
  Administered 2022-01-12: 80 mL via INTRAVENOUS

## 2022-01-12 MED ORDER — HYDROCORTISONE ACETATE 25 MG RE SUPP
25.0000 mg | Freq: Two times a day (BID) | RECTAL | 0 refills | Status: AC
Start: 2022-01-12 — End: ?
  Filled 2022-01-12: qty 12, 6d supply, fill #0

## 2022-01-12 NOTE — ED Notes (Signed)
No answer for triage.

## 2022-01-12 NOTE — ED Provider Notes (Signed)
[facility] EMERGENCY DEPT Provider Note   CSN: [id number] Arrival date & time: 01/12/22  1135     History  Chief Complaint  Patient presents with   Rectal Pain    Ross Mcgee is a 46 y.o. male.  Patient presents to the hospital complaining of rectal pain that began Saturday.  Patient states that he has some pain in the internal part of his rectum just after defecating.  He describes as burning.  Patient is concerned he may have a hemorrhoid.  He denies any rectal bleeding and has noticed no blood on toilet paper.  Patient denies abdominal pain, nausea, vomiting diarrhea, constipation.  Patient tried an over-the-counter hemorrhoid product at home with no relief.  Past medical history significant for abscess and acne  HPI     Home Medications Prior to Admission medications   Medication Sig Start Date End Date Taking? Authorizing Provider  hydrocortisone (ANUSOL-HC) 25 MG suppository Place 1 suppository (25 mg total) rectally 2 (two) times daily. 01/12/22  Yes Barrie Dunker B, PA-C  amoxicillin-clavulanate (AUGMENTIN) 875-125 MG tablet Take 1 tablet by mouth every 12 (twelve) hours. 05/12/20   Orvil Feil, PA-C  cetirizine (ZYRTEC ALLERGY) 10 MG tablet Take 1 tablet (10 mg total) by mouth daily for 10 days. 05/12/20 05/22/20  Orvil Feil, PA-C  diclofenac (VOLTAREN) 75 MG EC tablet Take 1 tablet (75 mg total) by mouth 2 (two) times daily. 07/02/21   Elson Areas, PA-C  diphenhydrAMINE (BENADRYL) 25 MG tablet Take 1 tablet (25 mg total) by mouth every 6 (six) hours as needed. 04/14/21   Karie Mainland, Amjad, PA-C  fluticasone (FLONASE) 50 MCG/ACT nasal spray Place 1 spray into both nostrils daily for 7 days. 05/12/20 05/19/20  Orvil Feil, PA-C  hydrOXYzine (ATARAX/VISTARIL) 25 MG tablet 1 po q6h prn itching 12/15/15   Ivery Quale, PA-C  menthol-cetylpyridinium (CEPACOL REGULAR STRENGTH) 3 MG lozenge Take 1 lozenge (3 mg total) by mouth as needed for sore throat.  01/12/20   Avegno, Zachery Dakins, FNP  methocarbamol (ROBAXIN) 500 MG tablet Take 1 tablet (500 mg total) by mouth 4 (four) times daily. 07/02/21   Elson Areas, PA-C  naproxen (NAPROSYN) 500 MG tablet Take 1 tablet (500 mg total) by mouth 2 (two) times daily. 02/16/17   Devoria Albe, MD  triamcinolone cream (KENALOG) 0.1 % Apply 1 application topically 2 (two) times daily. 12/15/15   Ivery Quale, PA-C  trimethoprim-polymyxin b (POLYTRIM) ophthalmic solution instill 1 drop of polymyxin B sulfate and trimethoprim sulfate ophthalmic solution (polymyxin B 95093 units/trimethoprim 1 mg per mL) to affected eye(s) every 3 hours for 7 to 10 days 11/22/20   Alvino Chapel, [place], PA-C      Allergies    Other    Review of Systems   Review of Systems  Constitutional:  Negative for fever.  Gastrointestinal:  Positive for rectal pain. Negative for abdominal pain and nausea.    Physical Exam Updated Vital Signs BP 136/84 (BP Location: Left Arm)   Pulse 82   Temp 98.4 F (36.9 C)   Resp 16   Ht 5\' 10"  (1.778 m)   Wt 96.1 kg   SpO2 98%   BMI 30.38 kg/m  Physical Exam Vitals and nursing note reviewed.  Constitutional:      General: He is not in acute distress.    Appearance: He is well-developed.  HENT:     Head: Normocephalic and atraumatic.  Eyes:     Conjunctiva/sclera: Conjunctivae normal.  Cardiovascular:     Rate and Rhythm: Normal rate and regular rhythm.     Heart sounds: No murmur heard. Pulmonary:     Effort: Pulmonary effort is normal. No respiratory distress.     Breath sounds: Normal breath sounds.  Abdominal:     Palpations: Abdomen is soft.     Tenderness: There is no abdominal tenderness.  Genitourinary:    Rectum: Internal hemorrhoid present.  Musculoskeletal:        General: No swelling.     Cervical back: Neck supple.  Skin:    General: Skin is warm and dry.     Capillary Refill: Capillary refill takes less than 2 seconds.  Neurological:     Mental Status: He is alert.   Psychiatric:        Mood and Affect: Mood normal.     ED Results / Procedures / Treatments   Labs (all labs ordered are listed, but only abnormal results are displayed) Labs Reviewed  BASIC METABOLIC PANEL - Abnormal; Notable for the following components:      Result Value   Glucose, Bld 61 (*)    All other components within normal limits  CBC    EKG None  Radiology CT PELVIS W CONTRAST  Result Date: 01/12/2022 CLINICAL DATA:  Anal/rectal abscess.  Rectal pain for 2 days. EXAM: CT PELVIS WITH CONTRAST TECHNIQUE: Multidetector CT imaging of the pelvis was performed using the standard protocol following the bolus administration of intravenous contrast. RADIATION DOSE REDUCTION: This exam was performed according to the departmental dose-optimization program which includes automated exposure control, adjustment of the mA and/or kV according to patient size and/or use of iterative reconstruction technique. CONTRAST:  32mL OMNIPAQUE IOHEXOL 300 MG/ML  SOLN COMPARISON:  CT abdomen and pelvis 05/23/2003 FINDINGS: Urinary Tract: No dilatation of the included distal ureters. Unremarkable bladder. Bowel: Mild thickening of the perianal soft tissues. No fluid collection. No dilatation of included pelvic small and large bowel loops. Unremarkable appendix. Vascular/Lymphatic: Minor atherosclerotic calcification at the aortic bifurcation. No enlarged lymph nodes. Reproductive:  Unremarkable prostate. Other:  No pelvic free fluid or hernia. Musculoskeletal: No acute osseous abnormality or suspicious osseous lesion. IMPRESSION: 1. Mild thickening of the perianal soft tissues. No abscess identified. 2. Aortic Atherosclerosis (ICD10-I70.0). Electronically Signed   By: Logan Bores M.D.   On: 01/12/2022 16:05    Procedures Procedures    Medications Ordered in ED Medications  iohexol (OMNIPAQUE) 300 MG/ML solution 100 mL (80 mLs Intravenous Contrast Given 01/12/22 1547)    ED Course/ Medical Decision  Making/ A&P                           Medical Decision Making Amount and/or Complexity of Data Reviewed Labs: ordered. Radiology: ordered.  Risk Prescription drug management.   This patient presents to the ED for concern of rectal pain, this involves an extensive number of treatment options, and is a complaint that carries with it a high risk of complications and morbidity.  The differential diagnosis includes hemorrhoids, abscess, anal fissure, and others   Co morbidities that complicate the patient evaluation  History of abscess   Additional history obtained:  External records from outside source obtained and reviewed including ED notes showing visits for back pain over the past year along with hives   Lab Tests:  I Ordered, and personally interpreted labs.  The pertinent results include: Grossly normal CBC and BMP   Imaging Studies ordered:  I ordered imaging studies including CT pelvis with contrast I independently visualized and interpreted imaging which showed mild thickening of the perianal tissues with no abscess. I agree with the radiologist interpretation   Test / Admission - Considered:  Patient with no abscess on CT.  No signs of anal fissure.  This appears to be an internal hemorrhoid.  Patient will prescribe Proctofoam and given hemorrhoid care instructions.  Patient to follow-up with [facility] surgery        Final Clinical Impression(s) / ED Diagnoses Final diagnoses:  Internal hemorrhoid    Rx / DC Orders ED Discharge Orders          Ordered    hydrocortisone (ANUSOL-HC) 25 MG suppository  2 times daily        01/12/22 1623              Pamala Duffel 01/12/22 1625    Glynn Octave, MD 01/12/22 1708

## 2022-01-12 NOTE — ED Triage Notes (Signed)
Pt states rectal pain x 2 days. Pt is concerned for hemorrhoids, but has not noted any blood.

## 2022-01-12 NOTE — Discharge Instructions (Addendum)
You were diagnosed in the with internal hemorrhoid.  Please use the provided suppositories.  If you continue to have problems I recommend following up with surgery at the number provided above.

## 2022-02-18 ENCOUNTER — Encounter: Payer: Self-pay | Admitting: Surgery

## 2022-09-04 ENCOUNTER — Other Ambulatory Visit (HOSPITAL_COMMUNITY): Payer: Self-pay

## 2022-11-18 ENCOUNTER — Emergency Department (HOSPITAL_BASED_OUTPATIENT_CLINIC_OR_DEPARTMENT_OTHER)
Admission: EM | Admit: 2022-11-18 | Discharge: 2022-11-18 | Disposition: A | Discharge status: Home / Self Care | Payer: Medicaid Other | Attending: Emergency Medicine | Admitting: Emergency Medicine

## 2022-11-18 ENCOUNTER — Other Ambulatory Visit: Payer: Self-pay

## 2022-11-18 ENCOUNTER — Encounter (HOSPITAL_BASED_OUTPATIENT_CLINIC_OR_DEPARTMENT_OTHER): Payer: Self-pay | Admitting: Emergency Medicine

## 2022-11-18 DIAGNOSIS — L03116 Cellulitis of left lower limb: Secondary | ICD-10-CM | POA: Insufficient documentation

## 2022-11-18 MED ORDER — IBUPROFEN 800 MG PO TABS
800.0000 mg | ORAL_TABLET | Freq: Once | ORAL | Status: AC
  Administered 2022-11-18: 800 mg via ORAL
  Filled 2022-11-18: qty 1

## 2022-11-18 MED ORDER — SULFAMETHOXAZOLE-TRIMETHOPRIM 800-160 MG PO TABS: 1.0000 | ORAL_TABLET | Freq: Two times a day (BID) | ORAL | 0 refills | Status: AC

## 2022-11-18 NOTE — ED Provider Notes (Signed)
[facility] Provider Note   CSN: [id number] Arrival date & time: 11/18/22  1650     History  Chief Complaint  Patient presents with   Abscess    Ross Mcgee is a 47 y.o. male with past medical history of an abscess on his buttock that required surgical removal, presenting with concern for painful bump on his left thigh.  States he noticed it about 4 days ago and it has continued to grow in size and has become more painful.  He has tried soaking it in Epsom salt which helped a little bit.  He reports it has been draining a pus-like material.  Denies any fevers or chills.  He took one of his girlfriends amoxicillin this morning before he came to the ER.   Abscess Associated symptoms: no fever        Home Medications Prior to Admission medications   Medication Sig Start Date End Date Taking? Authorizing Provider  sulfamethoxazole-trimethoprim (BACTRIM DS) 800-160 MG tablet Take 1 tablet by mouth 2 (two) times daily for 10 days. 11/18/22 11/28/22 Yes Arabella Merles, PA-C  cetirizine (ZYRTEC ALLERGY) 10 MG tablet Take 1 tablet (10 mg total) by mouth daily for 10 days. 05/12/20 05/22/20  Orvil Feil, PA-C  diclofenac (VOLTAREN) 75 MG EC tablet Take 1 tablet (75 mg total) by mouth 2 (two) times daily. 07/02/21   Elson Areas, PA-C  diphenhydrAMINE (BENADRYL) 25 MG tablet Take 1 tablet (25 mg total) by mouth every 6 (six) hours as needed. 04/14/21   Karie Mainland, Amjad, PA-C  fluticasone (FLONASE) 50 MCG/ACT nasal spray Place 1 spray into both nostrils daily for 7 days. 05/12/20 05/19/20  Orvil Feil, PA-C  hydrocortisone (ANUSOL-HC) 25 MG suppository Place 1 suppository (25 mg total) rectally 2 (two) times daily. 01/12/22   Darrick Grinder, PA-C  hydrOXYzine (ATARAX/VISTARIL) 25 MG tablet 1 po q6h prn itching 12/15/15   Ivery Quale, PA-C  menthol-cetylpyridinium (CEPACOL REGULAR STRENGTH) 3 MG lozenge Take 1 lozenge (3 mg total) by mouth as  needed for sore throat. 01/12/20   Avegno, Zachery Dakins, FNP  methocarbamol (ROBAXIN) 500 MG tablet Take 1 tablet (500 mg total) by mouth 4 (four) times daily. 07/02/21   Elson Areas, PA-C  naproxen (NAPROSYN) 500 MG tablet Take 1 tablet (500 mg total) by mouth 2 (two) times daily. 02/16/17   Devoria Albe, MD  triamcinolone cream (KENALOG) 0.1 % Apply 1 application topically 2 (two) times daily. 12/15/15   Ivery Quale, PA-C  trimethoprim-polymyxin b (POLYTRIM) ophthalmic solution instill 1 drop of polymyxin B sulfate and trimethoprim sulfate ophthalmic solution (polymyxin B 40981 units/trimethoprim 1 mg per mL) to affected eye(s) every 3 hours for 7 to 10 days 11/22/20   Alvino Chapel, [place], PA-C      Allergies    Other    Review of Systems   Review of Systems  Constitutional:  Negative for fever.    Physical Exam Updated Vital Signs BP 135/82 (BP Location: Right Arm)   Pulse 85   Temp 97.6 F (36.4 C) (Temporal)   Resp 14   Ht 5\' 10"  (1.778 m)   Wt 96.1 kg   SpO2 96%   BMI 30.40 kg/m  Physical Exam Vitals and nursing note reviewed.  Constitutional:      Appearance: Normal appearance.  HENT:     Head: Atraumatic.  Pulmonary:     Effort: Pulmonary effort is normal.  Skin:    Comments: Circular  raised area of erythema of the skin of the left thigh Area is indurated and hard, no areas of fluctuance Some erythema extending away from the indurated area of skin  Neurological:     General: No focal deficit present.     Mental Status: He is alert.  Psychiatric:        Mood and Affect: Mood normal.        Behavior: Behavior normal.          ED Results / Procedures / Treatments   Labs (all labs ordered are listed, but only abnormal results are displayed) Labs Reviewed - No data to display  EKG None  Radiology No results found.  Procedures Procedures    Medications Ordered in ED Medications  ibuprofen (ADVIL) tablet 800 mg (800 mg Oral Given 11/18/22 1839)    ED  Course/ Medical Decision Making/ A&P                                 Medical Decision Making  47 y.o. male with pertinent past medical history of previous abscess on the buttock presents to the ED for concern of bump of left thigh  Differential diagnosis includes but is not limited to cellulitis, abscess  ED Course:  Patient with painful red bump on his left thigh that he first noticed about 4 days ago.  States it seemed to improve slightly after taking Epsom salt baths but worsened today.  He notes drainage from the area.  He denies any fevers, chills.  CBC with no leukocytosis.  He has an area of induration and erythema, significant tenderness palpation over this area as well as the surrounding skin low on the thigh, groin and higher up on the abdomen.  Area was ultrasounded by Dr. Jarold Motto and we did not see any abscess that would require drainage today.  Ultrasound did show cobblestoning over the area of erythema.  Do not feel further imaging is needed at this time.  Will discharge with 10-day course of Bactrim.  He is instructed to follow-up if he develops fever, chills, spreading of the redness, any other new or concerning symptoms.  Does have a remote history of abscess to the buttock that he had surgically removed.  Given extent of erythema and pain, low threshold to consider IV antibiotics if he returns with worsening infection. Patient noted that he wears a harness for work that rubs on the area.  He was given a work note for him to return Monday to allow the antibiotics to take effect.  Want to decrease any risk of further skin infection or irritation from harness.    Impression: Cellulitis of the left thigh  Disposition:  The patient was discharged home with instructions to take Bactrim as prescribed.  May take ibuprofen for pain. Return precautions given.  Lab Tests: I Ordered, and personally interpreted labs.  The pertinent results include:   CBC with no leukocytosis BMP  significant for a low glucose of 61             Final Clinical Impression(s) / ED Diagnoses Final diagnoses:  Cellulitis of left thigh    Rx / DC Orders ED Discharge Orders          Ordered    sulfamethoxazole-trimethoprim (BACTRIM DS) 800-160 MG tablet  2 times daily        11/18/22 1844  Arabella Merles, PA-C 11/18/22 1858    Rondel Baton, MD 11/19/22 [phone]

## 2022-11-18 NOTE — ED Notes (Signed)
Dc instructions reviewed with patient. Patient voiced understanding. Dc with belongings.  °

## 2022-11-18 NOTE — ED Triage Notes (Signed)
Pt arrives to ED with c/o abscess x4 days to left hip.

## 2022-11-18 NOTE — Discharge Instructions (Addendum)
You have a skin infection of your left thigh.    You have been prescribed Bactrim. Take this antibiotic 2 times a day for the next 10 days. Take the full course of your antibiotic even if you start feeling better. Antibiotics may cause you to have diarrhea.    You may use up to 800mg  ibuprofen every 6 hours as needed for pain.  Please return the ER if you develop fever, chills, the redness continues to spread, you develop any new or worsening symptoms.

## 2022-11-18 NOTE — ED Notes (Signed)
Md /pa at bedside

## 2022-11-22 ENCOUNTER — Encounter (HOSPITAL_BASED_OUTPATIENT_CLINIC_OR_DEPARTMENT_OTHER): Payer: Self-pay | Admitting: Emergency Medicine

## 2022-11-22 ENCOUNTER — Emergency Department (HOSPITAL_BASED_OUTPATIENT_CLINIC_OR_DEPARTMENT_OTHER)
Admission: EM | Admit: 2022-11-22 | Discharge: 2022-11-22 | Disposition: A | Discharge status: Home / Self Care | Payer: Medicaid Other | Attending: Emergency Medicine | Admitting: Emergency Medicine

## 2022-11-22 ENCOUNTER — Other Ambulatory Visit: Payer: Self-pay

## 2022-11-22 DIAGNOSIS — Z23 Encounter for immunization: Secondary | ICD-10-CM | POA: Diagnosis not present

## 2022-11-22 DIAGNOSIS — L02416 Cutaneous abscess of left lower limb: Secondary | ICD-10-CM | POA: Insufficient documentation

## 2022-11-22 DIAGNOSIS — L0291 Cutaneous abscess, unspecified: Secondary | ICD-10-CM

## 2022-11-22 MED ORDER — OXYCODONE-ACETAMINOPHEN 5-325 MG PO TABS
1.0000 | ORAL_TABLET | Freq: Once | ORAL | Status: AC
  Administered 2022-11-22: 1 via ORAL
  Filled 2022-11-22: qty 1

## 2022-11-22 MED ORDER — TETANUS-DIPHTH-ACELL PERTUSSIS 5-2.5-18.5 LF-MCG/0.5 IM SUSY
0.5000 mL | PREFILLED_SYRINGE | Freq: Once | INTRAMUSCULAR | Status: AC
Start: 2022-11-22 — End: 2022-11-22
  Administered 2022-11-22: 0.5 mL via INTRAMUSCULAR
  Filled 2022-11-22: qty 0.5

## 2022-11-22 MED ORDER — OXYCODONE HCL 5 MG PO TABS: 5.0000 mg | ORAL_TABLET | Freq: Four times a day (QID) | ORAL | 0 refills | Status: AC | PRN

## 2022-11-22 MED ORDER — IBUPROFEN 600 MG PO TABS: 600.0000 mg | ORAL_TABLET | Freq: Four times a day (QID) | ORAL | 0 refills | Status: AC | PRN

## 2022-11-22 MED ORDER — LIDOCAINE-EPINEPHRINE 2 %-1:200000 IJ SOLN
10.0000 mL | Freq: Once | INTRAMUSCULAR | Status: AC
Start: 2022-11-22 — End: 2022-11-22
  Administered 2022-11-22: 10 mL
  Filled 2022-11-22: qty 20

## 2022-11-22 MED ORDER — IBUPROFEN 600 MG PO TABS: 600.0000 mg | ORAL_TABLET | Freq: Four times a day (QID) | ORAL | 0 refills | Status: DC | PRN

## 2022-11-22 MED ORDER — OXYCODONE HCL 5 MG PO TABS: 5.0000 mg | ORAL_TABLET | Freq: Four times a day (QID) | ORAL | 0 refills | Status: DC | PRN

## 2022-11-22 NOTE — ED Provider Notes (Signed)
[place] EMERGENCY DEPARTMENT AT [facility] Provider Note   CSN: [id number] Arrival date & time: 11/22/22  1258     History  Chief Complaint  Patient presents with   Abscess    Ross Mcgee is a 47 y.o. male.   Abscess   47 year old male presents emergency department complaints of abscess.  Patient states that he noted a red, raised area on his left lateral thigh 7-8 days ago.  Was seen on 31 July and was diagnosed with cellulitis and given Bactrim which patient states that he has been taking as prescribed.  States that over the past couple days, redness and pain has increased.  He has reported noting puslike drainage from area.  Denies any fever, weakness or sensory deficits in affected leg.  Patient is concerned about development of abscess. Past medical history significant for acne, allergy   Home Medications Prior to Admission medications   Medication Sig Start Date End Date Taking? Authorizing Provider  cetirizine (ZYRTEC ALLERGY) 10 MG tablet Take 1 tablet (10 mg total) by mouth daily for 10 days. 05/12/20 05/22/20  Orvil Feil, PA-C  diclofenac (VOLTAREN) 75 MG EC tablet Take 1 tablet (75 mg total) by mouth 2 (two) times daily. 07/02/21   Elson Areas, PA-C  diphenhydrAMINE (BENADRYL) 25 MG tablet Take 1 tablet (25 mg total) by mouth every 6 (six) hours as needed. 04/14/21   Karie Mainland, Amjad, PA-C  fluticasone (FLONASE) 50 MCG/ACT nasal spray Place 1 spray into both nostrils daily for 7 days. 05/12/20 05/19/20  Orvil Feil, PA-C  hydrocortisone (ANUSOL-HC) 25 MG suppository Place 1 suppository (25 mg total) rectally 2 (two) times daily. 01/12/22   Barrie Dunker B, PA-C  hydrOXYzine (ATARAX/VISTARIL) 25 MG tablet 1 po q6h prn itching 12/15/15   Ivery Quale, PA-C  ibuprofen (ADVIL) 600 MG tablet Take 1 tablet (600 mg total) by mouth every 6 (six) hours as needed. 11/22/22   Peter Garter, PA  menthol-cetylpyridinium (CEPACOL REGULAR STRENGTH) 3 MG lozenge Take  1 lozenge (3 mg total) by mouth as needed for sore throat. 01/12/20   Avegno, Zachery Dakins, FNP  methocarbamol (ROBAXIN) 500 MG tablet Take 1 tablet (500 mg total) by mouth 4 (four) times daily. 07/02/21   Elson Areas, PA-C  oxyCODONE (ROXICODONE) 5 MG immediate release tablet Take 1 tablet (5 mg total) by mouth every 6 (six) hours as needed for severe pain or breakthrough pain. 11/22/22   Peter Garter, PA  sulfamethoxazole-trimethoprim (BACTRIM DS) 800-160 MG tablet Take 1 tablet by mouth 2 (two) times daily for 10 days. 11/18/22 11/28/22  Arabella Merles, PA-C  triamcinolone cream (KENALOG) 0.1 % Apply 1 application topically 2 (two) times daily. 12/15/15   Ivery Quale, PA-C  trimethoprim-polymyxin b (POLYTRIM) ophthalmic solution instill 1 drop of polymyxin B sulfate and trimethoprim sulfate ophthalmic solution (polymyxin B 40981 units/trimethoprim 1 mg per mL) to affected eye(s) every 3 hours for 7 to 10 days 11/22/20   Alvino Chapel, [place], PA-C      Allergies    Other    Review of Systems   Review of Systems  All other systems reviewed and are negative.   Physical Exam Updated Vital Signs BP (!) 111/50   Pulse (!) 58   Temp 98.1 F (36.7 C)   Resp 18   SpO2 97%  Physical Exam Vitals and nursing note reviewed.  Constitutional:      General: He is not in acute distress.    Appearance:  He is well-developed.  HENT:     Head: Normocephalic and atraumatic.  Eyes:     Conjunctiva/sclera: Conjunctivae normal.  Cardiovascular:     Rate and Rhythm: Normal rate and regular rhythm.     Heart sounds: No murmur heard. Pulmonary:     Effort: Pulmonary effort is normal. No respiratory distress.     Breath sounds: Normal breath sounds.  Abdominal:     Palpations: Abdomen is soft.     Tenderness: There is no abdominal tenderness.  Musculoskeletal:        General: No swelling.     Cervical back: Neck supple.  Skin:    General: Skin is warm and dry.     Capillary Refill: Capillary  refill takes less than 2 seconds.     Comments: 3.0 x 4.0 cm diameter palpable fluctuant area noted on patient's left lateral thigh.  Surrounding erythema and indurated skin.  Area warm to the touch.  Some purulent drainage able to be expressed with squeeze.  Area very tender to palpation.  Neurological:     Mental Status: He is alert.  Psychiatric:        Mood and Affect: Mood normal.     ED Results / Procedures / Treatments   Labs (all labs ordered are listed, but only abnormal results are displayed) Labs Reviewed - No data to display  EKG None  Radiology No results found.  Procedures .Marland KitchenIncision and Drainage  Date/Time: 11/22/2022 6:58 PM  Performed by: Peter Garter, PA Authorized by: Peter Garter, PA   Consent:    Consent obtained:  Verbal   Consent given by:  Patient   Risks discussed:  Bleeding, incomplete drainage, pain and damage to other organs   Alternatives discussed:  No treatment Universal protocol:    Procedure explained and questions answered to patient or proxy's satisfaction: yes     Relevant documents present and verified: yes     Immediately prior to procedure, a time out was called: yes     Patient identity confirmed:  Verbally with patient and arm band Location:    Type:  Abscess   Size:  3.5   Location:  Lower extremity   Lower extremity location:  Leg   Leg location:  L upper leg Pre-procedure details:    Skin preparation:  Povidone-iodine Sedation:    Sedation type:  None Anesthesia:    Anesthesia method:  Local infiltration   Local anesthetic:  Lidocaine 2% WITH epi Procedure type:    Complexity:  Complex Procedure details:    Ultrasound guidance: no     Needle aspiration: no     Incision types:  Single straight   Incision depth:  Subcutaneous   Wound management:  Probed and deloculated, irrigated with saline, extensive cleaning and debrided   Drainage:  Purulent   Drainage amount:  Moderate   Wound treatment:  Wound left  open   Packing materials:  1/4 in gauze   Amount 1/4":  3" Post-procedure details:    Procedure completion:  Tolerated well, no immediate complications     Medications Ordered in ED Medications  lidocaine-EPINEPHrine (XYLOCAINE W/EPI) 2 %-1:200000 (PF) injection 10 mL (10 mLs Infiltration Given by Other 11/22/22 1428)  Tdap (BOOSTRIX) injection 0.5 mL (0.5 mLs Intramuscular Given 11/22/22 1549)  oxyCODONE-acetaminophen (PERCOCET/ROXICET) 5-325 MG per tablet 1 tablet (1 tablet Oral Given 11/22/22 1427)    ED Course/ Medical Decision Making/ A&P  Medical Decision Making Risk Prescription drug management.   This patient presents to the ED for concern of abscess, this involves an extensive number of treatment options, and is a complaint that carries with it a high risk of complications and morbidity.  The differential diagnosis includes cellulitis, erysipelas, x-rays fasciitis, abscess   Co morbidities that complicate the patient evaluation  See HPI   Additional history obtained:  Additional history obtained from EMR External records from outside source obtained and reviewed including hospital records   Lab Tests:  N/a   Imaging Studies ordered:  N/a   Cardiac Monitoring: / EKG:  The patient was maintained on a cardiac monitor.  I personally viewed and interpreted the cardiac monitored which showed an underlying rhythm of: Sinus rhythm   Consultations Obtained:  N/a   Problem List / ED Course / Critical interventions / Medication management  Abscess I ordered medication including Norco, lidocaine with epinephrine   Reevaluation of the patient after these medicines showed that the patient improved I have reviewed the patients home medicines and have made adjustments as needed   Social Determinants of Health:  Cigarette use.  Denies illicit drug use.   Test / Admission - Considered:  Abscess Vitals signs within normal range  and stable throughout visit. 47 year old male presents emergency department with complaints of abscess.  Patient found with abscess and cellulitic skin changes of surrounding skin of left lateral thigh.  Abscess drained in manner as pictured above with packing material placed given extent of material removed from abscess.  Patient without meeting of SIRS criteria per vital signs so laboratory studies were not performed.  Offered patient x-ray imaging of left lower extremity to assess for necrotizing infection the patient declined in favor of just having area drained;" patient without any palpable crepitus.  Given cellulitic skin changes, will recommend continued use of antibiotics in the outpatient setting in the form of Bactrim as well as Tylenol/Motrin for pain.  Patient educated regarding proper wound care at home.  Will recommend follow-up with primary care in 1 to 2 days for reevaluation of wound for assess for healing status.  Treatment plan discussed at length with patient and he acknowledged understanding was agreeable to said plan.  Patient overall well-appearing, afebrile no acute distress. Worrisome signs and symptoms were discussed with the patient, and the patient acknowledged understanding to return to the ED if noticed. Patient was stable upon discharge.          Final Clinical Impression(s) / ED Diagnoses Final diagnoses:  Abscess    Rx / DC Orders ED Discharge Orders     None         Peter Garter, [place] 11/22/22 Lavell Anchors, MD 11/25/22 1517

## 2022-11-22 NOTE — ED Triage Notes (Signed)
Pt presents to ED POV. Pt c/o L hip abscess. Pt reports he thinks has burst. Pt reports he was seen here on wed for same. Given antibiotics to go home with, taking compliantly. No fever noted at home.

## 2022-11-22 NOTE — ED Notes (Signed)
Dc instructions reviewed with patient. Patient voiced understanding. Dc with belongings.  °

## 2022-11-22 NOTE — Discharge Instructions (Signed)
As discussed, you have evidence of abscess on exam today which was drained.  Recommend keeping bandage placed in the emergency department over the next 24 to 36 hours before you replace it.  After that, recommend washing area with warm soapy water with daily bandage changes.  Continue take antibiotics as prescribed for the next 7 days.  You may take Tylenol/Motrin for baseline pain with pain medication for breakthrough pain.  Return if redness worsens, development of fever, pain worsens.  Otherwise, your infection is being treated with antibiotics and drainage of abscess today.
